# Patient Record
Sex: Female | Born: 1935 | Race: White | Hispanic: No | Smoking: Former smoker
Health system: Southern US, Community
[De-identification: ages and names within clinical notes are randomized; demographics above are authoritative.]

## PROBLEM LIST (undated history)

## (undated) DIAGNOSIS — M1990 Unspecified osteoarthritis, unspecified site: Secondary | ICD-10-CM

## (undated) DIAGNOSIS — I10 Essential (primary) hypertension: Secondary | ICD-10-CM

## (undated) DIAGNOSIS — IMO0001 Reserved for inherently not codable concepts without codable children: Secondary | ICD-10-CM

## (undated) DIAGNOSIS — Z5189 Encounter for other specified aftercare: Secondary | ICD-10-CM

## (undated) DIAGNOSIS — C801 Malignant (primary) neoplasm, unspecified: Secondary | ICD-10-CM

## (undated) DIAGNOSIS — C029 Malignant neoplasm of tongue, unspecified: Secondary | ICD-10-CM

## (undated) HISTORY — PX: TUBAL LIGATION: SHX77

## (undated) HISTORY — PX: WISDOM TOOTH EXTRACTION: SHX21

## (undated) HISTORY — DX: Unspecified osteoarthritis, unspecified site: M19.90

## (undated) HISTORY — PX: APPENDECTOMY: SHX54

## (undated) HISTORY — PX: CATARACT EXTRACTION: SUR2

## (undated) HISTORY — PX: TOTAL HIP ARTHROPLASTY: SHX124

## (undated) HISTORY — DX: Malignant (primary) neoplasm, unspecified: C80.1

## (undated) HISTORY — DX: Malignant neoplasm of tongue, unspecified: C02.9

---

## 2006-01-30 ENCOUNTER — Inpatient Hospital Stay (HOSPITAL_COMMUNITY): Admission: RE | Admit: 2006-01-30 | Discharge: 2006-02-04 | Payer: Self-pay | Admitting: Orthopedic Surgery

## 2007-01-05 ENCOUNTER — Inpatient Hospital Stay (HOSPITAL_COMMUNITY): Admission: RE | Admit: 2007-01-05 | Discharge: 2007-01-08 | Payer: Self-pay | Admitting: Orthopedic Surgery

## 2009-01-21 ENCOUNTER — Emergency Department (HOSPITAL_COMMUNITY): Admission: EM | Admit: 2009-01-21 | Discharge: 2009-01-21 | Payer: Self-pay | Admitting: Emergency Medicine

## 2010-11-27 NOTE — H&P (Signed)
Stephanie [name], CARNEIRO NO.:  [id number]   MEDICAL RECORD NO.:  [id number]          PATIENT TYPE:  INP   LOCATION:  NA                           FACILITY:  [facility]   PHYSICIAN:  Madlyn Frankel. Charlann Boxer, M.D.  DATE OF BIRTH:  12/01/1925   DATE OF ADMISSION:  01/05/2007  DATE OF DISCHARGE:                              HISTORY & PHYSICAL   PROCEDURE:  Right total hip arthroplasty.   CHIEF COMPLAINTS:  Right hip and groin pain.   HISTORY OF PRESENT ILLNESS:  This is a 77 year old female with a history  persistent progressive right hip and groin pain secondary to  osteoarthritis.  She also has a history of left total hip arthroplasty  back in July 2007 by Dr. Charlann Boxer.  Her right hip has gotten worse. It has  been refractory to all conservative treatments.  She walks with the use  of a cane.  She has been presurgical assessed by Dr. Casimiro Needle Altheimer.   PAST MEDICAL HISTORY:  1. Bilateral hip osteoarthritis  2. Left total hip replacement July 2007  3. Hypertension.   PAST SURGICAL HISTORIES:  Tubal ligation and left total hip replacement.   FAMILY HISTORY:  Heart disease, cancer.   SOCIAL HISTORY:  Married to  [employer] [facility].  She is a retired Runner, broadcasting/film/video.  Primary caregiver will be husband, children and friends.   DRUG ALLERGIES:  NO KNOWN DRUG ALLERGIES.   MEDICATIONS:  1. Klor-Con 20 mEq 1 tablet daily  2. __________ 300 mg one daily  3. Amlodipine 5 mg one daily  4. Carvedilol 6.25 mg 1 tablet b.i.d.  5. Altace 10 mg one daily  6. Evista 60 mg one daily.   REVIEW OF SYSTEMS:  GENITOURINARY:  Has issues with some urinary  frequency, urgency and trouble with  excessive urinating at night.  Otherwise see history of present illness.   PHYSICAL EXAM:  Pulse 72, respirations 18, blood pressure 134/76.  GENERAL:  Awake, alert and oriented, well-developed, well-nourished,in  no acute distress.  Does walk with a single point cane.  NECK:  Supple.  No carotid bruits.  CHEST/LUNGS:  Clear to auscultation bilaterally.  BREASTS:  Deferred.  HEART:  Regular rate and rhythm without gallops, clicks, rubs or  murmurs.  ABDOMEN:  Soft, nontender, nondistended.  Bowel sounds present.  GENITOURINARY:  Deferred.  EXTREMITIES:  Right lower extremity has dorsalis pedis pulse positive.  She has increased pain with range of motion.  She does have bilateral  edema both lower extremities.  SKIN:  No signs of cellulitis.  No skin breakdown.  NEUROLOGIC:  Intact distal sensibilities.   Labs, EKG, chest x-ray pending presurgical clearance performed on  12/31/2006.   IMPRESSION:  1. Right hip osteoarthritis  2. Status post a left total hip replacement  3. Hypertension.   PLAN OF ACTION:  A right total hip arthroplasty [facility] 01/05/2007  by surgeon Dr. Lajoyce Corners.  Risks and complications were discussed.  Questions were encouraged, answered, and  reviewed.   Postop medications were discussed and prescription given for pre-fill.     ______________________________  Yetta Glassman.  [address], [place]      Madlyn Frankel. Charlann Boxer, M.D.  Electronically Signed    BLM/MEDQ  D:  01/01/2007  T:  01/01/2007  Job:  [id number]

## 2010-11-27 NOTE — Op Note (Signed)
NAMEREMY, DIA NO.:  [id number]   MEDICAL RECORD NO.:  [id number]          PATIENT TYPE:  INP   LOCATION:  0002                         FACILITY:  [facility]   PHYSICIAN:  Madlyn Frankel. Charlann Boxer, M.D.  DATE OF BIRTH:  May 22, 1926   DATE OF PROCEDURE:  01/05/2007  DATE OF DISCHARGE:                               OPERATIVE REPORT   PREOPERATIVE DIAGNOSES:  1. Right hip osteoarthritis.  2. History of right total hip replacement.   POSTOPERATIVE DIAGNOSES:  1. Right hip osteoarthritis.  2. History of right total hip replacement.   PROCEDURE:  Right total hip replacement.   COMPONENTS USED:  DePuy hip system with a size 48 pinnacle cup, neutral  Marathon liner, a Tri-Lock size 4 high offset stem with a 28+5 ball.   SURGEON:  Charlann Boxer.   ASSISTANT:  Joan Mayans, P.A.C.   ANESTHESIA:  General.   BLOOD LOSS:  600.   DRAINS:  None.   COMPLICATIONS:  None.   INDICATIONS FOR THE PROCEDURE:  Ms. Vanleeuwen is a 77 year old female known  to me from bilateral hip arthritis.  She is now over a year out from her  left total hip replacement.  She has done very well and wished to  proceed with right hip replacement.  Reviewed risks and benefits of hip  dislocation, DVT, component failure, need for revision surgery.  Consent  was obtained.   PROCEDURE IN DETAIL:  The patient was brought to the operative theater.  Once adequate anesthesia and preoperative antibiotics, 1 g of Ancef,  were administered, patient was positioned in the left lateral decubitus  position with the right side up.  It was noted that in the supine  position I identified that the left lower extremity was 5 mm longer than  the right and we would keep this in mind perioperatively.  In the left  lateral decubitus position, I evaluated and positioned the legs and  flexed the hip and knee and did not find landmarks to determine her hip  stability and leg length.  This is all given the findings in the supine  position.   The right hip was then prescrubbed and prepped and draped in sterile  fashion.  A lateral-based incision was made through a posterior approach  to the hip.  The iliotibial band and gluteus fascia were incised in line  with incision.  The short external rotators were identified and taken  down separately from the posterior capsule.  A Bunnell capsulotomy was  made, preserving the posterior leaflet for later anatomic repair as well  as protection against the sciatic nerve.  The hip was dislocated and the  patient was noted to have severe degenerative changes to the right hip  with some femoral head collapse.   A neck osteotomy was made based off her anatomic position in the center  of the head in relationship to the hip of the trochanter.   Following neck osteotomy, first attention was directed to the femur.  I  then used a starting drill and then hand reamed and irrigated the canal  to prevent fat emboli.  I then broached up to initially expose size 3.  At this point the size 3 broach sat approximately a mm out from the  neck.   I then packed off the femur with a sponge and attended to the  acetabulum.  Acetabular exposure was obtained routinely.  Patient was  noted to have a very hyperemic and hypertrophic synovium and this was  debrided using a Bovie.  I then used a knife to perform a complete  labrectomy.  I then began reaming with a 43 reamer, then a 45, then a  47.  At this point the posterior wall was thinning and I did not feel  that it was necessary to jump a size as I had a good bony bed  preparation that would not affect my head size, given the component I  was planning on using which was a Marathon polyethylene liner.  In the  contralateral hip I used a 50 mm shell with a 28 mm head and I was not  going to correct that affected head size by going up to a 52 mm size.  At this point, a size 48 pinnacle cup was then impacted, it was  positioned at about 40 degrees  of abduction and 20 degrees of forward  flexion.  It was beneath the anterior wall anteriorly.  Two cancellous  bone screws were placed.  A neutral Marathon liner was then placed.  Attention was now directed back to trial reduction.  I placed a 3 stem  in place with a 28+1.5 ball.  There was a significant amount of shuck.  For this reason I went and changed this out to a 4 stem.  This 4 broach  sat about 4 mm from my neck cut.  With a 28+1.5 ball in place, this  significantly improved the shuck.  There was still a couple of  millimeters shuck despite not using spinal.  Trial reduction revealed  very stable range of motion.  The leg length appeared to be much more  equal at this point.  When I say equal what I mean compared to what we  had done from our preoperative state.  It still was felt a little bit  short and for that reason I trialed with a 28+5 ball.  I felt the hip  stability was excellent.  There was only a millimeter or two of shuck at  this point and the length of the leg appeared to be much more  appropriate compared to the other leg.  Given all of these parameters,  the trial components were removed.  The final 48 x 28 neutral Marathon  liner was then impacted into the pinnacle cup after placing a hole  eliminator.  I then impacted the size 4 high offset Tri-Lock stem.  This  sat at the level of where my broach was placed, which was approximately  4 mm from my neck cut.  Given this, I used a 28+5 ball.  This was  impacted on a dry joint and hip reduced.  The hip was irrigated  throughout the case and again at this point there is no significant  hemostasis.  There was some oozing during the case resulting in 600-700  mL blood loss mainly due to this hypertrophic synovium.  The patient was  also noted to have some elevated blood pressure during the case.  At the  time of the case I did use 5 mL of FloSeal.  There was no significant hemostasis required.  The posterior capsule was  reapproximated  superiorly using #1 Ethibond.  The remaining wound was closed in layers  with #1 Ethibond on the iliotibial band, #1 Vicryl in the gluteal  fascia, #2-0 Vicryl and #4-0 running Monocryl on the skin.  The skin was  clean, dry and dressed sterilely with Steri-Strips, Mepilex dressing.  She was then extubated and brought to the recovery room in stable  condition.      Madlyn Frankel Charlann Boxer, M.D.  Electronically Signed     MDO/MEDQ  D:  01/05/2007  T:  01/05/2007  Job:  [id number]

## 2010-11-30 NOTE — Discharge Summary (Signed)
[name], [name] NO.:  [id number]   MEDICAL RECORD NO.:  [id number]          PATIENT TYPE:  INP   LOCATION:  1603                         FACILITY:  [facility]   PHYSICIAN:  Madlyn Frankel. Charlann Boxer, M.D.  DATE OF BIRTH:  1926-01-04   DATE OF ADMISSION:  01/30/2006  DATE OF DISCHARGE:  02/04/2006                                 DISCHARGE SUMMARY   PRINCIPAL DIAGNOSIS:  Bilateral hip osteoarthritis, left greater than right.   SURGICAL PROCEDURES PERFORMED:  A left total hip replacement was performed  on January 30, 2006 by Dr. Charlann Boxer.   SECONDARY DIAGNOSES:  1.  Coronary artery disease.  2.  Hypertension.   DISCHARGE MEDICATIONS:  1.  Lovenox 30 mg subcutaneous daily x8 days to be stopped on February 12, 2006.  2.  Colace 100 mg p.o. b.i.d. p.r.n. for constipation while on pain      medication.  3.  Iron supplements 325 mg p.o. t.i.d. x3 weeks.  4.  Pain medication will be Vicodin one to two tablets p.o. q.4-6h. p.r.n.      for pain.  5.  Robaxin 500 mg p.o. q.i.d. p.r.n. for spasms.  6.  Her home medications include Altace 10 mg p.o. q.a.m., Norvasc 5 mg p.o.      every morning, Evista 60 mg p.o. every morning, calcium 600 mg four      times daily, vitamin C 500 mg four times daily, vitamin A and D,      glucosamine chondroitin sulfate which she does not need at this point,      multivitamin p.o. daily, and a B complex in the morning.   BRIEF HOSPITAL COURSE:  The patient was admitted on January 30, 2006 for a left  total hip replacement.  She had an uneventful total hip replacement  procedure.  She was admitted to the floor.   She did have some hypokalemia during her hospital stay.  This was corrected  with potassium supplementation.   She progressed well through her hospital course without any major  complications.  She did not require any transfusion.  She was seen and  evaluated by physical therapy, got her out of bed.  Based on her evaluation  initially with  therapy home health could have been an option for her.  However, she has no social help at home with a husband who is fairly sick  and family unable to care for her and him as well.  For this reason, we  consulted case management to help with short-term skilled nursing facility  for rehabilitation.  This was arranged with the family on February 04, 2003 for  discharge on February 04, 2006.   DISCHARGE INSTRUCTIONS:  The patient will be discharged with a prescription  of partial weightbearing on left lower extremity for two weeks.  She will  progress from a walker to a cane at that point.  I will see her back in the  office two weeks from the date of surgery for a wound evaluation.  She will  remain on Lovenox for 10 days  postoperatively and then be switched to  aspirin for four weeks orally daily 325 mg.   Medical questions could be addressed to Dr. Casimiro Needle Altheimer and orthopedic  issues addressed to Dr. Madlyn Frankel. Olin at [phone].      Madlyn Frankel Charlann Boxer, M.D.  Electronically Signed     MDO/MEDQ  D:  02/04/2006  T:  02/04/2006  Job:  [id number]

## 2010-11-30 NOTE — Op Note (Signed)
NAMEBEDIE, DOMINEY NO.:  [id number]   MEDICAL RECORD NO.:  [id number]          PATIENT TYPE:  INP   LOCATION:  0002                         FACILITY:  [facility]   PHYSICIAN:  Madlyn Frankel. Charlann Boxer, M.D.  DATE OF BIRTH:  [birth year]   DATE OF PROCEDURE:  01/30/2006  DATE OF DISCHARGE:                                 OPERATIVE REPORT   PREOPERATIVE DIAGNOSIS:  End-stage left hip osteoarthritis.   POSTOPERATIVE DIAGNOSIS:  End-stage left hip osteoarthritis.   PROCEDURE:  Left total hip replacement.   COMPONENTS USED:  DePuy hip system with size 50 pinnacle cup, 2 cancellous  bone screws, a 28+ 20 degree high wall liner placed at the 2 to 3 position  posteriorly.   The femoral stem with a Trilock high offset stem size 11.3 with a 28 1.5  ball.   SURGEON:  Madlyn Frankel. Charlann Boxer, M.D.   ASSISTANTDruscilla Brownie. Cherlynn June.   ANESTHESIA:  General.   BLOOD LOSS:  250.   DRAINS:  One.   COMPLICATIONS:  None.   INDICATIONS FOR PROCEDURE:  Ms. Primiano is a pleasant 77 year old female who  has been seen in the office for advanced degenerative changes of the left  hip.  She had significant reduction in her quality of life, and after  reviewing risks and benefits of operative intervention, she wished to  proceed with surgical replacement hip.  Risks and benefits were discussed  including infection, DVT, dislocation, persistent discomfort, and need for  future surgery.   Consent was obtained.   PROCEDURE IN DETAIL:  The patient was brought to operative theater.  Once  adequate anesthesia and preoperative antibiotics, 1 gram of Ancef, were  administered, the patient was positioned in the right lateral decubitus  position with the left side up.  The left lower extremity was then prepped  and draped in a sterile fashion after prescrub.  A curvilinear lateral based  incision was made for posterior approach to the hip.  The iliotibial band  and gluteus maximus fascia were  incised in line with incision.  The short  external rotators were taken down separate from the posterior capsule which  was saved for repair anatomically as well as used for protection against the  sciatic nerve with retractors.   The hip was dislocated, and femoral head was noted be severely deformed with  flattening deformity and severe erosive changes.  The neck osteotomy was  made based off of anatomic landmarks and preoperative templating in  relationship to the tip of the trochanter.   Following this, attention was first directed to the femur.  Femoral exposure  obtained with retractors and prepared per protocol for the Trilock system.  The canal was opened, irrigated and hand reamed singly and then broached.  Broaching was carried up initially all the way to a 10 to establish  diversion of the femur at 20-25 degrees.  Attention was then directed to the  acetabulum.   Acetabular exposure was obtained including labrectomy, and capsular tissues  were preserved.  The reaming commenced initially with a 43 reamer to get  through the medial wall osteophyte and synovium.  I then reamed up  sequentially.  Bony bed preparation appeared to be very good.  There was  some thinning of the posterior wall, but it remained intact.  The columns  superior and inferiorly posteriorly were all very stable and stout.  I  decided to stop with a 49 reamer so as to not ream further bone.  This would  limit my head size only.   Final 50-mm pinnacle cup was then impacted with an excellent scratch fit.  Please note the patient did have acetabular cysts that were curetted out and  packed with femoral head bone graft.  The cup was positioned and checked in  position and noted to be between 35 and 45 degrees of abduction and 15 to 20  degrees of for flexion.  It was beneath the anterior rim.   Two cancellous bone screws were placed into the ileum for initial support.  The trial liner was then positioned in  neutral.   Attention was then redirected back to the femur.  Final preparation of the  femur was carried out.  Initially, I trialed with a 10 broach which was able  to the subsided slightly beneath my neck cut.  I trialed this to check for  stability.   With this, the leg lengths appeared to be within proximity of one another.  However, with traction and extension, there was a significant amount of  shuck.  With this, I went up to the 11.3 broach.  This took a little bit  effort to prepare the proximal femur but without complication.  It was a  little proud compared to the neck cut initially.  Trial reduction was  carried out.  The hip stability was excellent with flexion and internal  rotation.  At about 70 degrees, there was a little bit of lift off with  internal rotation and neutral abduction and forward flexion.   This was kept in line for my final components.   At this point, final components were brought to the field.  The central hole  eliminator was positioned into the center portion of the cup.  I did choose  a 28 lipped liner to provide a little bit of support with flexion and  internal rotation.  This was impacted into position with the lipped portion  between the 2 and 3 o'clock position posteriorly.   The final 11.3 broach was then brought into the field and impacted.  I was  able to get this impacted very close to my initial neck cut which provided  security now from leg length standpoint.  Trial reduction was then carried  out with a 1.5 ball, and I felt very happy with the stability, leg lengths  and this current system.  The final 28 1.5 ball was impacted and noted to be  stable.   At this time and throughout the case, the wound was irrigated normal saline  solution.  I reapproximated posterior capsule to the superior leaflet in an  anatomic fashion.  The Hemovac drain was then placed deep.  The remainder of the wound was closed in layers with #1 Vicryl on the   iliotibial band and gluteal fascia.  Four-0 Monocryl was used on the skin.  The hip was cleaned, dried and dressed sterilely with Steri-Strips, dressing  and sponge tape.  The patient was brought to the recovery room in stable  condition.      Madlyn Frankel Charlann Boxer, M.D.  Electronically  Signed     MDO/MEDQ  D:  01/30/2006  T:  01/30/2006  Job:  [id number]

## 2010-11-30 NOTE — Discharge Summary (Signed)
NAMEJOSELINE, Stephanie [name] NO.:  [id number]   MEDICAL RECORD NO.:  [id number]          PATIENT TYPE:  INP   LOCATION:  1611                         FACILITY:  [facility]   PHYSICIAN:  Madlyn Frankel. Charlann Boxer, M.D.  DATE OF BIRTH:  Jul 05, 1926   DATE OF ADMISSION:  01/05/2007  DATE OF DISCHARGE:  01/08/2007                               DISCHARGE SUMMARY   ADMITTING DIAGNOSES:  1. Osteoarthritis.  2. Status post left total hip replacement, July 2007.  3. Hypertension.   DISCHARGE DIAGNOSES:  1. Osteoarthritis.  2. Status post right total hip replacement.  3. Status post left total hip replacement.  4. Hypertension.  5. Acute blood loss anemia.  6. Postoperative hypokalemia.   CONSULTATIONS:  None.   PROCEDURE:  A right total hip arthroplasty.  Surgeon:  Madlyn Frankel. Charlann Boxer,  M.D.  Assistant:  Jamelle Rushing, P.A.  Components were metal-on-metal.   HISTORY OF PRESENT ILLNESS:  Ms. Full is a 77 year old female with a  history of persistent progressive right hip and groin pain secondary to  osteoarthritis.  She does have a history of left total hip arthroplasty.  Her right hip had gotten worse and been refractory to all conservative  treatments.  She has had to use a single-point cane.  She was cleared  pre-surgically by Dr. Casimiro Needle Altheimer.   LABORATORY:  Preoperative hematocrit 39.4, post-op day #1, it was 23.7;  transfused 2 units and it came back up to 26.6.  At discharge it was  stable at 24.4.  Coagulation within normal limits.  Chemistry:  Glucose  on admission 114, all other normal.  On post-op day #1, she had some  hypokalemia at 3.1.  She was replenished and it came back up to 4.2.  Her glucose went up a little bit to 156 at discharge.  Her kidney  function remained stable throughout.  Her GI function within normal  limits.  UA negative.   CARDIOLOGY:  EKG:  Normal sinus rhythm.   RADIOLOGY:  Chest, two-view, back in 2007, showed no active disease.  AP  pelvis  showed normal positive appearance of right total hip replacement.   HOSPITAL COURSE:  The patient underwent right total hip replacement,  tolerated the procedure well, was admitted to the orthopedic floor.  She  remained neurovascularly intact throughout.  Pain was well controlled  throughout.  Postoperative day #2, we started changing the dressing.  There was no sign of infection.  No serosanguineous ooze.  It was noted  she did have some postoperative blood loss and therefore was transfused  2 units due to acute blood loss anemia on post-op day #1 to #2.  Her  hematocrit came back up afterwards.  She remained afebrile throughout.  PT OT were begun.  Weightbearing as tolerated with the use of a rolling  walker.  She progressed nicely.  By the time of discharge, she was able  to walk approximately 60 feet with verbal cues and minimal assistance.  PT, OT, case worker recommendations were for home health care physical  therapy.   DISCHARGE DISPOSITION:  Discharged home healthcare  PT.   DISCHARGE WOUND CARE:  Keep the wound dry and change dressing on a daily  basis.   DISCHARGE DIET:  No restrictions.   DISCHARGE PHYSICAL THERAPY:  Weightbearing as tolerated with the use of  a rolling walker for 2 weeks, then transition to single-point cane.  Goals of physical therapy, work on gait training, proprioception,  minimize pain, maximize strength, increase range of motion.   DISCHARGE MEDICATIONS:  1. Vicodin 5/325, one to two p.o. q.4-6 h. p.r.n. pain.  2. Robaxin 500 mg, one p.o. q.6 h. muscle spasm.  3. Lovenox 40 mg, one subcutaneous daily x2 weeks and then aspirin,      enteric coated 325 mg one p.o. daily x4 weeks.  4. Iron 325 mg, one p.o. t.i.d. x3 weeks.  5. Colace 100 mg p.o. b.i.d.   HOME MEDICATIONS:  1. Evista 60 mg one q.a.m.  2. Amlodipine 5 mg, one p.o. q.a.m.  3. Carvedilol 6.25 mg, one twice a day.  4. Altace 10 mg, one q.a.m.  5. Tekturna 300 mg, one q.a.m.  6.  Klor-Con 20 mEq, one q.a.m.  7. Vitamins C, A, D, plus calcium, multivitamin and iron, B complex,      glucosamine/chondroitin, magnesium.   DISCHARGE INSTRUCTIONS:  1. Follow up with Dr. Charlann Boxer on twelfth floor, 3900, , in two weeks for      wound care check.  2. If develop acute shortness of breath, severe calf pain, call      emergency services immediately.     ______________________________  Yetta Glassman [address], [place]      Madlyn Frankel. Charlann Boxer, M.D.  Electronically Signed    BLM/MEDQ  D:  01/26/2007  T:  01/26/2007  Job:  [id number]

## 2010-11-30 NOTE — H&P (Signed)
Stephanie [name], Stephanie [name] NO.:  [id number]   MEDICAL RECORD NO.:  [id number]          PATIENT TYPE:  INP   LOCATION:  NA                           FACILITY:  [facility]   PHYSICIAN:  Madlyn Frankel. Charlann Boxer, M.D.  DATE OF BIRTH:  13-Feb-1926   DATE OF ADMISSION:  01/30/2006  DATE OF DISCHARGE:                                HISTORY & PHYSICAL   CHIEF COMPLAINT:  Pain in my hips, more so in the left than right.   HISTORY OF PRESENT ILLNESS:  This 77 year old lady who has been having  bilateral hip pain for a considerable period of time.  Now she has pain  radiating to the groin to the point where she has to use a cane for  ambulation, and any sustained distance must be transversed by wheelchair.  She has developed a limp, mainly on the left side.  Trendelenburg gait is  also noted.  She has limitation of range of motion of the hip and has a  considerable amount of pain and discomfort on end rotation.   After much discussion, including the risks and benefits of surgery, it was  decided to go ahead with total hip replacement arthroplasty on the most  symptomatic hip, which is her left hip.  She has been cleared preoperatively  by Dr. Casimiro Needle Altheimer.   PAST MEDICAL HISTORY:  Dr. Leslie Dales is treating her for hypertension.   CURRENT MEDICATIONS:  1.  Altace 10 mg daily.  2.  Amlodipine (Norvasc) 5 mg daily.  3.  Hydrocodone for pain.   PAST SURGICAL HISTORY:  Tubal ligation, appendectomy, tonsillectomy in the  past.   FAMILY HISTORY:  Positive for heart disease in the mother and cancer in the  father.   SOCIAL HISTORY:  Patient is married, retired.  No intake of alcohol or  tobacco products.  Has three children.   REVIEW OF SYSTEMS:  CNS:  No seizures, paralysis, numbness, double vision.  RESPIRATORY:  No productive cough.  No hemoptysis.  No shortness of breath.  CARDIOVASCULAR:  No chest pain.  No angina.  No orthopnea.  GASTROINTESTINAL:  No nausea, vomiting, melena,  or bloody stools.  GENITOURINARY:  No discharge, dysuria, or hematuria.  MUSCULOSKELETAL:  Primarily in present illness.   PHYSICAL EXAMINATION:  VITAL SIGNS:  Blood pressure 132/74, pulse 76,  respirations 12.  GENERAL:  She is an alert, cooperative, and friendly white female.  HEENT:  Normocephalic.  PERRLA.  Oropharynx is clear.  CHEST:  Clear to auscultation.  No rales or rhonchi.  HEART:  Regular rate and rhythm.  No murmurs are heard.  ABDOMEN:  Soft and nontender.  Liver and spleen not felt.  RECTAL/GENITALIA/PELVIC/BREASTS:  Not done.  Not pertinent to the present  illness.  EXTREMITIES:  Left hip, as in present illness above.   ADMITTING DIAGNOSES:  1.  Osteoarthritis of both hips, more so on the left than on the right.  2.  Hypertension.  3.  Osteoarthritis.   PLAN:  Patient is to undergone left total hip replacement arthroplasty.  Her  husband is being worked up for mini  strokes versus early Alzheimer's and  really could not take care of her at home.  For this reason, we will seek a  skilled nursing facility that has a good rehab program or [facility]  Rehabilitation after her regular hospitalization.  We will ask the discharge  planners to work with [place] on that.  Should we have any medical problems, we  will certainly call Dr. Leslie Dales to assist [place] with any medical problems.      Dooley L. Cherlynn June.      Madlyn Frankel Charlann Boxer, M.D.  Electronically Signed    DLU/MEDQ  D:  01/23/2006  T:  01/23/2006  Job:  [id number]   cc:   Veverly Fells. Altheimer, M.D.  Fax: [phone]

## 2011-05-01 LAB — CBC
HCT: 23.7 — ABNORMAL LOW
HCT: 39.4
Hemoglobin: 9.2 — ABNORMAL LOW
MCHC: 34.6
MCHC: 34.8
Platelets: 234
Platelets: 292
RBC: 2.83 — ABNORMAL LOW
RBC: 4.64
RDW: 14.2 — ABNORMAL HIGH
RDW: 14.4 — ABNORMAL HIGH
WBC: 7.5

## 2011-05-01 LAB — TYPE AND SCREEN: ABO/RH(D): O POS

## 2011-05-01 LAB — URINALYSIS, ROUTINE W REFLEX MICROSCOPIC
Glucose, UA: NEGATIVE
Protein, ur: NEGATIVE
Specific Gravity, Urine: 1.02
Urobilinogen, UA: 0.2

## 2011-05-01 LAB — HEMOGLOBIN AND HEMATOCRIT, BLOOD: Hemoglobin: 8.5 — ABNORMAL LOW

## 2011-05-01 LAB — COMPREHENSIVE METABOLIC PANEL
AST: 27
GFR calc Af Amer: >60
Total Bilirubin: 0.6

## 2011-05-01 LAB — BASIC METABOLIC PANEL
BUN: 11
CO2: 29
Creatinine, Ser: 0.78
GFR calc Af Amer: >60
GFR calc non Af Amer: >60
Glucose, Bld: 108 — ABNORMAL HIGH
Glucose, Bld: 156 — ABNORMAL HIGH

## 2011-05-01 LAB — APTT: aPTT: 32

## 2011-08-11 ENCOUNTER — Ambulatory Visit (INDEPENDENT_AMBULATORY_CARE_PROVIDER_SITE_OTHER): Payer: Medicare Other

## 2011-08-11 DIAGNOSIS — S61409A Unspecified open wound of unspecified hand, initial encounter: Secondary | ICD-10-CM | POA: Diagnosis not present

## 2011-08-11 DIAGNOSIS — M25549 Pain in joints of unspecified hand: Secondary | ICD-10-CM | POA: Diagnosis not present

## 2011-08-11 DIAGNOSIS — S60229A Contusion of unspecified hand, initial encounter: Secondary | ICD-10-CM

## 2011-08-11 DIAGNOSIS — S065X9A Traumatic subdural hemorrhage with loss of consciousness of unspecified duration, initial encounter: Secondary | ICD-10-CM | POA: Diagnosis not present

## 2011-08-11 DIAGNOSIS — S065XAA Traumatic subdural hemorrhage with loss of consciousness status unknown, initial encounter: Secondary | ICD-10-CM | POA: Diagnosis not present

## 2011-08-12 ENCOUNTER — Ambulatory Visit (INDEPENDENT_AMBULATORY_CARE_PROVIDER_SITE_OTHER): Payer: Medicare Other

## 2011-08-12 DIAGNOSIS — S61409A Unspecified open wound of unspecified hand, initial encounter: Secondary | ICD-10-CM

## 2011-08-13 DIAGNOSIS — E785 Hyperlipidemia, unspecified: Secondary | ICD-10-CM | POA: Diagnosis not present

## 2011-08-13 DIAGNOSIS — R7301 Impaired fasting glucose: Secondary | ICD-10-CM | POA: Diagnosis not present

## 2011-08-13 DIAGNOSIS — E559 Vitamin D deficiency, unspecified: Secondary | ICD-10-CM | POA: Diagnosis not present

## 2011-08-13 DIAGNOSIS — I10 Essential (primary) hypertension: Secondary | ICD-10-CM | POA: Diagnosis not present

## 2011-08-13 DIAGNOSIS — M810 Age-related osteoporosis without current pathological fracture: Secondary | ICD-10-CM | POA: Diagnosis not present

## 2011-08-15 DIAGNOSIS — E785 Hyperlipidemia, unspecified: Secondary | ICD-10-CM | POA: Diagnosis not present

## 2011-08-15 DIAGNOSIS — R7301 Impaired fasting glucose: Secondary | ICD-10-CM | POA: Diagnosis not present

## 2011-08-15 DIAGNOSIS — I10 Essential (primary) hypertension: Secondary | ICD-10-CM | POA: Diagnosis not present

## 2011-08-15 DIAGNOSIS — M1990 Unspecified osteoarthritis, unspecified site: Secondary | ICD-10-CM | POA: Diagnosis not present

## 2011-08-15 DIAGNOSIS — M810 Age-related osteoporosis without current pathological fracture: Secondary | ICD-10-CM | POA: Diagnosis not present

## 2011-08-16 ENCOUNTER — Ambulatory Visit (INDEPENDENT_AMBULATORY_CARE_PROVIDER_SITE_OTHER): Payer: Medicare Other | Admitting: Family Medicine

## 2011-08-16 VITALS — BP 142/90 | HR 86 | Temp 98.9°F | Resp 18 | Ht 58.5 in | Wt 131.6 lb

## 2011-08-16 DIAGNOSIS — L02519 Cutaneous abscess of unspecified hand: Secondary | ICD-10-CM

## 2011-08-16 DIAGNOSIS — L03113 Cellulitis of right upper limb: Secondary | ICD-10-CM

## 2011-08-16 DIAGNOSIS — L03119 Cellulitis of unspecified part of limb: Secondary | ICD-10-CM | POA: Diagnosis not present

## 2011-08-16 DIAGNOSIS — M79609 Pain in unspecified limb: Secondary | ICD-10-CM

## 2011-08-16 DIAGNOSIS — M25449 Effusion, unspecified hand: Secondary | ICD-10-CM

## 2011-08-16 DIAGNOSIS — I10 Essential (primary) hypertension: Secondary | ICD-10-CM | POA: Insufficient documentation

## 2011-08-16 DIAGNOSIS — M79641 Pain in right hand: Secondary | ICD-10-CM

## 2011-08-16 NOTE — Patient Instructions (Signed)
Woundcare with normal sailine BID

## 2011-08-16 NOTE — Progress Notes (Signed)
  Subjective:    Patient ID: Stephanie [name], female    DOB: [birth year], 78 y.o.   MRN: [id number]  Fall The accident occurred 3 to 5 days ago. The fall occurred while walking. She landed on hard floor. The point of impact was the left wrist. Pertinent negatives include no fever, headaches or hematuria.  Laceration  The incident occurred 3 to 5 days ago. The laceration is located on the right hand. The laceration mechanism was a blunt object. The pain is moderate. The pain has been fluctuating since onset. Her tetanus status is UTD.  Patient fell on Sunday 5 days ago and Tuesday night started having swelling. Xrays was negative for any acute fracture. She went to see her PCP yesterday and was given Keflex for possible infection. Right hand is hot and swollen.    Review of Systems  Constitutional: Negative.  Negative for fever and chills.  Eyes: Negative for visual disturbance.  Respiratory: Negative for chest tightness, shortness of breath and wheezing.   Cardiovascular: Negative for chest pain, palpitations and leg swelling.  Genitourinary: Negative for dysuria and hematuria.  Musculoskeletal: Positive for joint swelling.  Neurological: Negative for dizziness, light-headedness and headaches.       Objective:   Physical Exam  Constitutional: She is oriented to person, place, and time. She appears well-developed and well-nourished.  HENT:  Head: Normocephalic and atraumatic.  Eyes: EOM are normal.  Neck: Normal range of motion. Neck supple.  Cardiovascular: Normal rate, regular rhythm and normal heart sounds.   Pulmonary/Chest: Effort normal and breath sounds normal. She has no wheezes.  Abdominal: Soft. Bowel sounds are normal.  Neurological: She is alert and oriented to person, place, and time.  Skin: Skin is warm. No rash noted. There is erythema. No pallor.       Right hand laceration with stitches, draining , erythematous, warm  Psychiatric: She has a normal mood and affect.    Neurovascularly intact. + ecchymosis. Full Rom, Serosainguinous fluid. Slightly warm left hand on dorsum.  No signs of cellulitis , pt is on Keflex. She has draining but not purulent.       Assessment & Plan:  1. Right hand swelling-? Cellulitis, c/w Keflex. If no improvement then return in 2 days. I have taken out all the stitches to improve wound healing and drainage.There is good approximation of 90% of skin but there will be an area in the middle which is still draining fluid that will close by secondary intention.  2. Right hand pain-improving slowly

## 2011-08-23 ENCOUNTER — Ambulatory Visit (INDEPENDENT_AMBULATORY_CARE_PROVIDER_SITE_OTHER): Payer: Medicare Other | Admitting: Family Medicine

## 2011-08-23 VITALS — BP 147/72 | HR 71 | Temp 97.9°F | Resp 16 | Ht 60.0 in | Wt 129.8 lb

## 2011-08-23 DIAGNOSIS — S61409A Unspecified open wound of unspecified hand, initial encounter: Secondary | ICD-10-CM | POA: Diagnosis not present

## 2011-08-23 NOTE — Patient Instructions (Signed)
Return if further problems. She can gently wash at the scan. She was urged to exercise her grip with that hand.

## 2011-08-23 NOTE — Progress Notes (Signed)
  Subjective:    Patient ID: Stephanie [name], female    DOB: 1925-07-19, 78 y.o.   MRN: [id number]  HPI patient is here for recheck of her hand. She saw Dr. Conley Rolls last week who put Steri-Strips on after having to remove the sutures. She had a hematoma which was probably a little infected it has gradually gotten better. She is able to move her fingers okay to   Review of Systems Unremarkable    Objective:   Physical Exam arthritic appearing hand with bruising down to all 4 fingertips. She has a resolving hematoma on the back of her hand. The 2 remaining Steri-Strips were removed. The wound is intact. Grip is satisfactory.  The x-ray report had shown a question of an old or recent fracture at the base of the fifth metacarpal. She is not tender in that area. It is felt that that is not significant.        Assessment & Plan:  Wound hand with cellulitis and hematoma improved  She only needs to be seen if further problems arise.

## 2011-09-11 DIAGNOSIS — C029 Malignant neoplasm of tongue, unspecified: Secondary | ICD-10-CM

## 2011-09-11 DIAGNOSIS — C801 Malignant (primary) neoplasm, unspecified: Secondary | ICD-10-CM

## 2011-09-11 DIAGNOSIS — C021 Malignant neoplasm of border of tongue: Secondary | ICD-10-CM | POA: Diagnosis not present

## 2011-09-11 HISTORY — DX: Malignant (primary) neoplasm, unspecified: C80.1

## 2011-09-11 HISTORY — DX: Malignant neoplasm of tongue, unspecified: C02.9

## 2011-09-16 DIAGNOSIS — C029 Malignant neoplasm of tongue, unspecified: Secondary | ICD-10-CM | POA: Diagnosis not present

## 2011-09-18 ENCOUNTER — Other Ambulatory Visit: Payer: Self-pay | Admitting: Otolaryngology

## 2011-09-18 ENCOUNTER — Other Ambulatory Visit (HOSPITAL_COMMUNITY): Payer: Self-pay | Admitting: Otolaryngology

## 2011-09-18 DIAGNOSIS — C029 Malignant neoplasm of tongue, unspecified: Secondary | ICD-10-CM

## 2011-09-19 ENCOUNTER — Ambulatory Visit
Admission: RE | Admit: 2011-09-19 | Discharge: 2011-09-19 | Disposition: A | Discharge status: Home / Self Care | Payer: Medicare Other | Source: Ambulatory Visit | Attending: Otolaryngology | Admitting: Otolaryngology

## 2011-09-19 ENCOUNTER — Encounter: Payer: Self-pay | Admitting: *Deleted

## 2011-09-19 DIAGNOSIS — M1990 Unspecified osteoarthritis, unspecified site: Secondary | ICD-10-CM | POA: Insufficient documentation

## 2011-09-19 DIAGNOSIS — C029 Malignant neoplasm of tongue, unspecified: Secondary | ICD-10-CM | POA: Diagnosis not present

## 2011-09-19 DIAGNOSIS — M47812 Spondylosis without myelopathy or radiculopathy, cervical region: Secondary | ICD-10-CM | POA: Diagnosis not present

## 2011-09-19 MED ORDER — IOHEXOL 300 MG/ML  SOLN
75.0000 mL | Freq: Once | INTRAMUSCULAR | Status: AC | PRN
  Administered 2011-09-19: 75 mL via INTRAVENOUS

## 2011-09-20 ENCOUNTER — Ambulatory Visit
Admission: RE | Admit: 2011-09-20 | Discharge: 2011-09-20 | Disposition: A | Discharge status: Home / Self Care | Payer: Medicare Other | Source: Ambulatory Visit | Attending: Radiation Oncology | Admitting: Radiation Oncology

## 2011-09-20 ENCOUNTER — Encounter: Payer: Self-pay | Admitting: Radiation Oncology

## 2011-09-20 DIAGNOSIS — C021 Malignant neoplasm of border of tongue: Secondary | ICD-10-CM | POA: Insufficient documentation

## 2011-09-20 DIAGNOSIS — I10 Essential (primary) hypertension: Secondary | ICD-10-CM | POA: Diagnosis not present

## 2011-09-20 DIAGNOSIS — C023 Malignant neoplasm of anterior two-thirds of tongue, part unspecified: Secondary | ICD-10-CM | POA: Diagnosis not present

## 2011-09-20 DIAGNOSIS — C029 Malignant neoplasm of tongue, unspecified: Secondary | ICD-10-CM

## 2011-09-20 DIAGNOSIS — Z87891 Personal history of nicotine dependence: Secondary | ICD-10-CM | POA: Insufficient documentation

## 2011-09-20 DIAGNOSIS — Z79899 Other long term (current) drug therapy: Secondary | ICD-10-CM | POA: Insufficient documentation

## 2011-09-20 HISTORY — DX: Essential (primary) hypertension: I10

## 2011-09-20 NOTE — Progress Notes (Signed)
Please see the Nurse Progress Note in the MD Initial Consult Encounter for this patient. 

## 2011-09-20 NOTE — Progress Notes (Signed)
PCP Dr Altheimer  Pt has NOT had cataract surgery, impending , also impending bilat shoulder replacements.  Pt denies pain, but takes Hydrocodone prn for shoulder pain. Appetite good.

## 2011-09-24 ENCOUNTER — Encounter (HOSPITAL_COMMUNITY)
Admission: RE | Admit: 2011-09-24 | Discharge: 2011-09-24 | Disposition: A | Discharge status: Home / Self Care | Payer: Medicare Other | Source: Ambulatory Visit | Attending: Otolaryngology | Admitting: Otolaryngology

## 2011-09-24 ENCOUNTER — Encounter (HOSPITAL_COMMUNITY)
Admission: RE | Admit: 2011-09-24 | Discharge: 2011-09-24 | Disposition: A | Discharge status: Home / Self Care | Payer: Medicare Other | Source: Ambulatory Visit | Attending: Anesthesiology | Admitting: Anesthesiology

## 2011-09-24 ENCOUNTER — Other Ambulatory Visit: Payer: Self-pay

## 2011-09-24 ENCOUNTER — Encounter (HOSPITAL_COMMUNITY): Payer: Self-pay

## 2011-09-24 DIAGNOSIS — Z6826 Body mass index (BMI) 26.0-26.9, adult: Secondary | ICD-10-CM | POA: Diagnosis not present

## 2011-09-24 DIAGNOSIS — C029 Malignant neoplasm of tongue, unspecified: Secondary | ICD-10-CM | POA: Diagnosis not present

## 2011-09-24 DIAGNOSIS — R220 Localized swelling, mass and lump, head: Secondary | ICD-10-CM | POA: Diagnosis not present

## 2011-09-24 DIAGNOSIS — R221 Localized swelling, mass and lump, neck: Secondary | ICD-10-CM | POA: Diagnosis not present

## 2011-09-24 DIAGNOSIS — M129 Arthropathy, unspecified: Secondary | ICD-10-CM | POA: Diagnosis not present

## 2011-09-24 DIAGNOSIS — I10 Essential (primary) hypertension: Secondary | ICD-10-CM | POA: Diagnosis not present

## 2011-09-24 DIAGNOSIS — I517 Cardiomegaly: Secondary | ICD-10-CM | POA: Diagnosis not present

## 2011-09-24 DIAGNOSIS — Z01811 Encounter for preprocedural respiratory examination: Secondary | ICD-10-CM | POA: Diagnosis not present

## 2011-09-24 HISTORY — DX: Reserved for inherently not codable concepts without codable children: IMO0001

## 2011-09-24 HISTORY — DX: Encounter for other specified aftercare: Z51.89

## 2011-09-24 LAB — BASIC METABOLIC PANEL
CO2: 28 mEq/L (ref 19–32)
Chloride: 107 mEq/L (ref 96–112)
Glucose, Bld: 130 mg/dL — ABNORMAL HIGH (ref 70–99)
Potassium: 4 mEq/L (ref 3.5–5.1)
Sodium: 143 mEq/L (ref 135–145)

## 2011-09-24 LAB — CBC
HCT: 39.5 % (ref 36.0–46.0)
Hemoglobin: 12.7 g/dL (ref 12.0–15.0)
MCH: 28.2 pg (ref 26.0–34.0)
MCV: 87.6 fL (ref 78.0–100.0)
Platelets: 267 10*3/uL (ref 150–400)
RBC: 4.51 MIL/uL (ref 3.87–5.11)
WBC: 8.3 10*3/uL (ref 4.0–10.5)

## 2011-09-24 NOTE — Pre-Procedure Instructions (Signed)
20 Stephanie [name]  09/24/2011   Your procedure is scheduled on:  09/26/2011@9 :50AM.  Report to [facility] at 7:50 AM.  Call this number if you have problems the morning of surgery: [phone]   Remember:   Do not eat food:After Midnight.  May have clear liquids: up to 4 Hours before arrival(nothing after 3:50AM).  Clear liquids include soda, tea, black coffee, apple or grape juice, broth.  Take these medicines the morning of surgery with A SIP OF WATER: Hydrocodone-Acetaminophen, Amlodipine, Carvedilol, Evista   Do not wear jewelry, make-up or nail polish.  Do not wear lotions, powders, or perfumes. You may wear deodorant.  Do not shave 48 hours prior to surgery.  Do not bring valuables to the hospital.  Contacts, dentures or bridgework may not be worn into surgery.  Leave suitcase in the car. After surgery it may be brought to your room.  For patients admitted to the hospital, checkout time is 11:00 AM the day of discharge.   Patients discharged the day of surgery will not be allowed to drive home.  Name and phone number of your driver:   Special Instructions: CHG Shower Use Special Wash: 1/2 bottle night before surgery and 1/2 bottle morning of surgery.   Please read over the following fact sheets that you were given: Pain Booklet, Coughing and Deep Breathing and Surgical Site Infection Prevention

## 2011-09-24 NOTE — Progress Notes (Signed)
Abnormal EKG noted @PAT  visit.  Denies chest pain,& shortness of breath. Pt denies EKG, stress test, Echo from any provider.  Noted in Epic that pt had hip surgery in 01/2006.  Could not find EKG from that encounter.  Revonda Standard notified that chart needs to be reviewed.//L. Vara Mairena,RN

## 2011-09-25 ENCOUNTER — Encounter (HOSPITAL_COMMUNITY): Payer: Self-pay | Admitting: Pharmacy Technician

## 2011-09-25 NOTE — Consult Note (Signed)
Anesthesia:  Patient is a 78 year old female scheduled for a left hemiglossectomy for a tongue mass on 09/26/11.  PCP is Dr. Leslie Dales.  History includes bilateral THA, HTN, former smoker, arthritis, and history of blood transfusion.  Labs acceptable.  CXR shows: 1. Cardiomegaly and bronchitic changes.  2. No focal pulmonary abnormality.  3. Retrolisthesis of the upper lumbar spine versus positioning.  4. Degenerative changes in both shoulders.  EKG shows NSR, possible anterior infarct (age undetermined).  I think it is overall stable when compared to her EKG from 01/01/07.  CM was noted on her pre-operative CXR. She denied any prior cardiac testing. She did not report any CV symptoms at PAT.  Clinical correlation on the day of surgery.  If remains asymptomatic and no worrisome CV findings on exam, then anticipate she can proceed as planned.

## 2011-09-26 ENCOUNTER — Encounter (HOSPITAL_COMMUNITY)
Admission: RE | Disposition: A | Discharge status: Home / Self Care | Payer: Self-pay | Source: Ambulatory Visit | Attending: Otolaryngology

## 2011-09-26 ENCOUNTER — Encounter (HOSPITAL_COMMUNITY): Payer: Self-pay | Admitting: Vascular Surgery

## 2011-09-26 ENCOUNTER — Inpatient Hospital Stay (HOSPITAL_COMMUNITY)
Admission: RE | Admit: 2011-09-26 | Discharge: 2011-09-27 | DRG: 138 | Disposition: A | Discharge status: Home / Self Care | Payer: Medicare Other | Source: Ambulatory Visit | Attending: Otolaryngology | Admitting: Otolaryngology

## 2011-09-26 ENCOUNTER — Encounter (HOSPITAL_COMMUNITY): Payer: Self-pay | Admitting: *Deleted

## 2011-09-26 ENCOUNTER — Ambulatory Visit (HOSPITAL_COMMUNITY): Payer: Medicare Other | Admitting: Vascular Surgery

## 2011-09-26 DIAGNOSIS — Z6826 Body mass index (BMI) 26.0-26.9, adult: Secondary | ICD-10-CM | POA: Diagnosis not present

## 2011-09-26 DIAGNOSIS — I10 Essential (primary) hypertension: Secondary | ICD-10-CM | POA: Diagnosis not present

## 2011-09-26 DIAGNOSIS — Z886 Allergy status to analgesic agent status: Secondary | ICD-10-CM

## 2011-09-26 DIAGNOSIS — M129 Arthropathy, unspecified: Secondary | ICD-10-CM | POA: Diagnosis present

## 2011-09-26 DIAGNOSIS — Z87891 Personal history of nicotine dependence: Secondary | ICD-10-CM

## 2011-09-26 DIAGNOSIS — C029 Malignant neoplasm of tongue, unspecified: Secondary | ICD-10-CM

## 2011-09-26 DIAGNOSIS — Z96649 Presence of unspecified artificial hip joint: Secondary | ICD-10-CM | POA: Diagnosis not present

## 2011-09-26 DIAGNOSIS — Z9849 Cataract extraction status, unspecified eye: Secondary | ICD-10-CM

## 2011-09-26 DIAGNOSIS — Z79899 Other long term (current) drug therapy: Secondary | ICD-10-CM

## 2011-09-26 DIAGNOSIS — R220 Localized swelling, mass and lump, head: Secondary | ICD-10-CM | POA: Diagnosis not present

## 2011-09-26 HISTORY — PX: HEMIGLOSSECTOMY: SHX1740

## 2011-09-26 SURGERY — HEMIGLOSSECTOMY
Anesthesia: General | Site: Mouth | Laterality: Left | Wound class: Clean Contaminated

## 2011-09-26 MED ORDER — AMLODIPINE BESYLATE 5 MG PO TABS
5.0000 mg | ORAL_TABLET | Freq: Every day | ORAL | Status: DC
  Administered 2011-09-27: 5 mg via ORAL
  Filled 2011-09-26 (×2): qty 1

## 2011-09-26 MED ORDER — ONDANSETRON HCL 4 MG/2ML IJ SOLN: 4.0000 mg | INTRAMUSCULAR | Status: DC | PRN

## 2011-09-26 MED ORDER — CLINDAMYCIN PHOSPHATE 600 MG/4ML IJ SOLN
INTRAMUSCULAR | Status: AC
  Filled 2011-09-26: qty 4

## 2011-09-26 MED ORDER — RAMIPRIL 10 MG PO CAPS
10.0000 mg | ORAL_CAPSULE | Freq: Every day | ORAL | Status: DC
  Administered 2011-09-27: 10 mg via ORAL
  Filled 2011-09-26 (×2): qty 1

## 2011-09-26 MED ORDER — ONDANSETRON HCL 4 MG/2ML IJ SOLN
INTRAMUSCULAR | Status: DC | PRN
  Administered 2011-09-26: 4 mg via INTRAVENOUS

## 2011-09-26 MED ORDER — ACETAMINOPHEN 325 MG PO TABS: 325.0000 mg | ORAL_TABLET | ORAL | Status: DC | PRN

## 2011-09-26 MED ORDER — DEXAMETHASONE SODIUM PHOSPHATE 10 MG/ML IJ SOLN
INTRAMUSCULAR | Status: DC | PRN
  Administered 2011-09-26: 6 mg via INTRAVENOUS

## 2011-09-26 MED ORDER — DEXTROSE 5 % IV SOLN
INTRAVENOUS | Status: AC
  Filled 2011-09-26: qty 50

## 2011-09-26 MED ORDER — NEOSTIGMINE METHYLSULFATE 1 MG/ML IJ SOLN
INTRAMUSCULAR | Status: DC | PRN
  Administered 2011-09-26: 3 mg via INTRAVENOUS

## 2011-09-26 MED ORDER — KETOROLAC TROMETHAMINE 30 MG/ML IJ SOLN
INTRAMUSCULAR | Status: AC
  Filled 2011-09-26: qty 1

## 2011-09-26 MED ORDER — EPHEDRINE SULFATE 50 MG/ML IJ SOLN
INTRAMUSCULAR | Status: DC | PRN
  Administered 2011-09-26: 5 mg via INTRAVENOUS

## 2011-09-26 MED ORDER — FENTANYL CITRATE 0.05 MG/ML IJ SOLN
25.0000 ug | INTRAMUSCULAR | Status: DC | PRN
  Administered 2011-09-26 (×4): 25 ug via INTRAVENOUS

## 2011-09-26 MED ORDER — ONDANSETRON HCL 4 MG PO TABS: 4.0000 mg | ORAL_TABLET | ORAL | Status: DC | PRN

## 2011-09-26 MED ORDER — BACITRACIN ZINC 500 UNIT/GM EX OINT
1.0000 "application " | TOPICAL_OINTMENT | Freq: Three times a day (TID) | CUTANEOUS | Status: DC
  Filled 2011-09-26: qty 15

## 2011-09-26 MED ORDER — CARVEDILOL 12.5 MG PO TABS
12.5000 mg | ORAL_TABLET | Freq: Two times a day (BID) | ORAL | Status: DC
  Administered 2011-09-27: 12.5 mg via ORAL
  Filled 2011-09-26 (×3): qty 1

## 2011-09-26 MED ORDER — HYDROMORPHONE HCL PF 1 MG/ML IJ SOLN
1.0000 mg | INTRAMUSCULAR | Status: DC | PRN
  Administered 2011-09-27: 0.5 mg via INTRAVENOUS
  Filled 2011-09-26: qty 1

## 2011-09-26 MED ORDER — MIDAZOLAM HCL 2 MG/2ML IJ SOLN: 0.5000 mg | Freq: Once | INTRAMUSCULAR | Status: DC | PRN

## 2011-09-26 MED ORDER — 0.9 % SODIUM CHLORIDE (POUR BTL) OPTIME
TOPICAL | Status: DC | PRN
  Administered 2011-09-26: 1000 mL

## 2011-09-26 MED ORDER — RALOXIFENE HCL 60 MG PO TABS
60.0000 mg | ORAL_TABLET | Freq: Every day | ORAL | Status: DC
  Administered 2011-09-27: 60 mg via ORAL
  Filled 2011-09-26 (×2): qty 1

## 2011-09-26 MED ORDER — KETOROLAC TROMETHAMINE 30 MG/ML IJ SOLN
15.0000 mg | Freq: Once | INTRAMUSCULAR | Status: AC | PRN
  Administered 2011-09-26: 15 mg via INTRAVENOUS

## 2011-09-26 MED ORDER — HYDROCODONE-ACETAMINOPHEN 7.5-500 MG/15ML PO SOLN
10.0000 mL | ORAL | Status: DC | PRN
  Administered 2011-09-26: 15 mL via ORAL
  Administered 2011-09-27 (×2): 10 mL via ORAL
  Filled 2011-09-26 (×3): qty 15

## 2011-09-26 MED ORDER — FENTANYL CITRATE 0.05 MG/ML IJ SOLN
INTRAMUSCULAR | Status: DC | PRN
  Administered 2011-09-26 (×2): 50 ug via INTRAVENOUS
  Administered 2011-09-26: 150 ug via INTRAVENOUS

## 2011-09-26 MED ORDER — ALISKIREN FUMARATE 150 MG PO TABS
300.0000 mg | ORAL_TABLET | Freq: Every day | ORAL | Status: DC
Start: 2011-09-26 — End: 2011-09-27
  Administered 2011-09-27: 300 mg via ORAL
  Filled 2011-09-26 (×2): qty 2

## 2011-09-26 MED ORDER — LACTATED RINGERS IV SOLN
INTRAVENOUS | Status: DC | PRN
  Administered 2011-09-26: 10:00:00 via INTRAVENOUS

## 2011-09-26 MED ORDER — PROPOFOL 10 MG/ML IV EMUL
INTRAVENOUS | Status: DC | PRN
  Administered 2011-09-26: 120 mg via INTRAVENOUS

## 2011-09-26 MED ORDER — ESMOLOL HCL 10 MG/ML IV SOLN
INTRAVENOUS | Status: DC | PRN
  Administered 2011-09-26: 10 mg via INTRAVENOUS
  Administered 2011-09-26: 20 mg via INTRAVENOUS

## 2011-09-26 MED ORDER — GLYCOPYRROLATE 0.2 MG/ML IJ SOLN
INTRAMUSCULAR | Status: DC | PRN
  Administered 2011-09-26: 0.4 mg via INTRAVENOUS

## 2011-09-26 MED ORDER — PROMETHAZINE HCL 25 MG/ML IJ SOLN: 6.2500 mg | INTRAMUSCULAR | Status: DC | PRN

## 2011-09-26 MED ORDER — CLINDAMYCIN PHOSPHATE 600 MG/50ML IV SOLN
600.0000 mg | Freq: Three times a day (TID) | INTRAVENOUS | Status: DC
  Administered 2011-09-26 – 2011-09-27 (×3): 600 mg via INTRAVENOUS
  Filled 2011-09-26 (×5): qty 50

## 2011-09-26 MED ORDER — MEPERIDINE HCL 25 MG/ML IJ SOLN: 6.2500 mg | INTRAMUSCULAR | Status: DC | PRN

## 2011-09-26 MED ORDER — LACTATED RINGERS IV SOLN
INTRAVENOUS | Status: DC
  Administered 2011-09-26: 09:00:00 via INTRAVENOUS
  Administered 2011-09-27: 50 mL/h via INTRAVENOUS

## 2011-09-26 MED ORDER — CLINDAMYCIN PHOSPHATE 600 MG/50ML IV SOLN
INTRAVENOUS | Status: DC | PRN
  Administered 2011-09-26: 600 mg via INTRAVENOUS

## 2011-09-26 MED ORDER — ROCURONIUM BROMIDE 100 MG/10ML IV SOLN
INTRAVENOUS | Status: DC | PRN
  Administered 2011-09-26: 30 mg via INTRAVENOUS

## 2011-09-26 SURGICAL SUPPLY — 38 items
BLADE SURG 15 STRL LF DISP TIS (BLADE) IMPLANT
BLADE SURG 15 STRL SS (BLADE)
CLEANER TIP ELECTROSURG 2X2 (MISCELLANEOUS) ×2 IMPLANT
CLOTH BEACON ORANGE TIMEOUT ST (SAFETY) ×2 IMPLANT
CONT SPEC 4OZ CLIKSEAL STRL BL (MISCELLANEOUS) ×2 IMPLANT
COVER SURGICAL LIGHT HANDLE (MISCELLANEOUS) ×2 IMPLANT
COVER TABLE BACK 60X90 (DRAPES) ×1 IMPLANT
CRADLE DONUT ADULT HEAD (MISCELLANEOUS) ×1 IMPLANT
ELECT COATED BLADE 2.86 ST (ELECTRODE) ×2 IMPLANT
ELECT REM PT RETURN 9FT ADLT (ELECTROSURGICAL) ×2
ELECTRODE REM PT RTRN 9FT ADLT (ELECTROSURGICAL) ×1 IMPLANT
GAUZE SPONGE 4X4 16PLY XRAY LF (GAUZE/BANDAGES/DRESSINGS) IMPLANT
GLOVE BIO SURGEON STRL SZ7 (GLOVE) ×1 IMPLANT
GLOVE BIOGEL PI IND STRL 6.5 (GLOVE) IMPLANT
GLOVE BIOGEL PI IND STRL 7.0 (GLOVE) IMPLANT
GLOVE BIOGEL PI INDICATOR 6.5 (GLOVE) ×1
GLOVE BIOGEL PI INDICATOR 7.0 (GLOVE) ×1
GLOVE ECLIPSE 7.5 STRL STRAW (GLOVE) ×2 IMPLANT
GLOVE SURG SS PI 6.5 STRL IVOR (GLOVE) ×1 IMPLANT
GLOVE SURG SS PI 7.0 STRL IVOR (GLOVE) ×2 IMPLANT
GOWN STRL NON-REIN LRG LVL3 (GOWN DISPOSABLE) ×6 IMPLANT
GUARD TEETH (MISCELLANEOUS) IMPLANT
KIT BASIN OR (CUSTOM PROCEDURE TRAY) ×2 IMPLANT
KIT ROOM TURNOVER OR (KITS) ×2 IMPLANT
NS IRRIG 1000ML POUR BTL (IV SOLUTION) ×2 IMPLANT
PAD ARMBOARD 7.5X6 YLW CONV (MISCELLANEOUS) ×3 IMPLANT
PENCIL FOOT CONTROL (ELECTRODE) ×2 IMPLANT
SPECIMEN JAR SMALL (MISCELLANEOUS) ×1 IMPLANT
SURGILUBE 2OZ TUBE FLIPTOP (MISCELLANEOUS) IMPLANT
SUT SILK 2 0 SH CR/8 (SUTURE) IMPLANT
SUT SILK 3 0 (SUTURE) ×2
SUT SILK 3-0 18XBRD TIE 12 (SUTURE) IMPLANT
SUT VIC AB 3-0 FS2 27 (SUTURE) ×14 IMPLANT
TOWEL OR 17X24 6PK STRL BLUE (TOWEL DISPOSABLE) IMPLANT
TOWEL OR 17X26 10 PK STRL BLUE (TOWEL DISPOSABLE) ×2 IMPLANT
TRAY ENT MC OR (CUSTOM PROCEDURE TRAY) ×2 IMPLANT
TUBE CONNECTING 12X1/4 (SUCTIONS) ×1 IMPLANT
WATER STERILE IRR 1000ML POUR (IV SOLUTION) ×1 IMPLANT

## 2011-09-26 NOTE — Op Note (Signed)
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 Estimated blood loss approximately 71 cc 78 year old with a mass in the left tongue that was biopsy and confirmation of squamous cell carcinoma. We discussed the procedure of removal of the tumor. She also was given information regarding radiation oncology and radiation treatments. She elected not to proceed with a neck dissection even after discussion of the risk of metastatic disease to the neck. She would prefer to follow that closely. We discussed the procedure including risks, benefits, and options. All questions are answered and consent was obtained. Procedure: Patient was taken to the operating room placed in the supine position after general endotracheal tube anesthesia was draped in the usual sterile manner. The tongue was grasped with a towel clip in the midline and then retracted. An incision was made around the tumor with a 1.5 cm margin circumferentially. The electrocautery was used to do to dissected cutting through the tongue and this portion of the tongue was removed marked with suture for anatomic identification and sent for frozen section. The frozen section had negative margins. The wound was irrigated with saline and closed with interrupted 3-0 Vicryl sutures. The oral cavity oropharynx is suctioned out of all blood and debris. The patient was then awakened brought to cover stable condition counts correct

## 2011-09-26 NOTE — Anesthesia Postprocedure Evaluation (Signed)
Anesthesia Post Note  Patient: Stephanie [name]  Procedure(s) Performed: Procedure(s) (LRB): HEMIGLOSSECTOMY (Left)  Anesthesia type: GA  Patient location: PACU  Post pain: Pain level controlled  Post assessment: Post-op Vital signs reviewed  Last Vitals:  Filed Vitals:   09/26/11 1145  BP: 180/78  Pulse: 75  Temp:   Resp: 14    Post vital signs: Reviewed  Level of consciousness: sedated  Complications: No apparent anesthesia complications

## 2011-09-26 NOTE — Transfer of Care (Signed)
Immediate Anesthesia Transfer of Care Note  Patient: Stephanie [name]  Procedure(s) Performed: Procedure(s) (LRB): HEMIGLOSSECTOMY (Left)  Patient Location: PACU  Anesthesia Type: General  Level of Consciousness: awake, alert , oriented and sedated  Airway & Oxygen Therapy: Patient Spontanous Breathing and Patient connected to nasal cannula oxygen  Post-op Assessment: Report given to PACU RN, Post -op Vital signs reviewed and stable and Patient moving all extremities  Post vital signs: Reviewed and stable  Complications: No apparent anesthesia complications

## 2011-09-26 NOTE — Preoperative (Signed)
Beta Blockers   Reason not to administer Beta Blockers:Not Applicable 

## 2011-09-26 NOTE — Progress Notes (Signed)
Patient ID: Stephanie [name], female   DOB: October 19, 1925, 78 y.o.   MRN: [id number] Awake and alert. Able to speak and breathing without any difficulty. Tongue suture line intact without swelling or hematoma. No other abnormal findings. Continue overnight care.

## 2011-09-26 NOTE — H&P (Signed)
Stephanie [name] is an 78 y.o. female.   Chief Complaint: Left tongue mass HPI: 78 year old who's had a mass in the left tongue that his enlarged. She was seen by her oral surgeon and a biopsy was performed which was diagnosed as squamous cell carcinoma. She had a CT scan which showed no obvious mass in the tongue but it did show some lymph nodes in the neck that were not pathologic. We discussed neck dissection but she did not want to proceed with this despite knowing the risk of metastatic disease to the neck. She is now ready to proceed with the left tongue resection.  Past Medical History  Diagnosis Date  . Tongue cancer 09/11/11    squamous cell carcinoma  . Cancer 09/11/11    tongue-squamous cell  . Arthritis   . Hypertension   . Blood transfusion     Last hip surgery.    Past Surgical History  Procedure Date  . Cataract surgery   . Total hip arthroplasty     bialt  . [name] tooth extraction   . Tubal ligation   . Appendectomy     w/BTL    Family History  Problem Relation Age of Onset  . Heart attack Mother   . Cancer Father     leukemia  . Cancer Brother     prostate, brain tumor,lung  . Diabetes Maternal Uncle   . Diabetes Maternal Grandfather   . Cancer Daughter     NHL   Social History:  reports that she quit smoking about 55 years ago. She does not have any smokeless tobacco history on file. She reports that she does not drink alcohol or use illicit drugs.  Allergies:  Allergies  Allergen Reactions  . Morphine And Related     Pt states she has never taken but will not take due to husband's severe reaction to Morphine.    No current facility-administered medications on file as of 09/26/2011.   Medications Prior to Admission  Medication Sig Dispense Refill  . acetaminophen (TYLENOL) 500 MG tablet Take 500 mg by mouth every 6 (six) hours as needed. For pain      . aliskiren (TEKTURNA) 300 MG tablet Take 300 mg by mouth daily.      Marland Kitchen amLODipine (NORVASC) 5 MG  tablet Take 5 mg by mouth daily.      . carvedilol (COREG) 6.25 MG tablet Take 12.5 mg by mouth 2 (two) times daily at 10 am and 4 pm.      . cholecalciferol (VITAMIN D) 1000 UNITS tablet Take 1,000 Units by mouth 2 (two) times daily before lunch and supper.      Marland Kitchen HYDROcodone-acetaminophen (NORCO) 5-325 MG per tablet Take 1 tablet by mouth every 8 (eight) hours as needed. For pain      . Multiple Vitamin (MULTIVITAMIN) tablet Take 1 tablet by mouth daily.      . potassium chloride SA (K-DUR,KLOR-CON) 20 MEQ tablet Take 20 mEq by mouth daily.      . raloxifene (EVISTA) 60 MG tablet Take 60 mg by mouth daily.      . ramipril (ALTACE) 10 MG capsule Take 10 mg by mouth daily.      . vitamin C (ASCORBIC ACID) 500 MG tablet Take 500 mg by mouth daily.      . cephALEXin (KEFLEX) 500 MG capsule Take 500 mg by mouth every 6 (six) hours.        Results for orders placed during the hospital encounter  of 09/24/11 (from the past 48 hour(s))  BASIC METABOLIC PANEL     Status: Abnormal   Collection Time   09/24/11  2:13 PM      Component Value Range Comment   Sodium 143  135 - 145 (mEq/L)    Potassium 4.0  3.5 - 5.1 (mEq/L)    Chloride 107  96 - 112 (mEq/L)    CO2 28  19 - 32 (mEq/L)    Glucose, Bld 130 (*) 70 - 99 (mg/dL)    BUN 22  6 - 23 (mg/dL)    Creatinine, Ser 1.61  0.50 - 1.10 (mg/dL)    Calcium 9.4  8.4 - 10.5 (mg/dL)    GFR calc non Af Amer 79 (*) >90 (mL/min)    GFR calc Af Amer >90  >90 (mL/min)   CBC     Status: Normal   Collection Time   09/24/11  2:13 PM      Component Value Range Comment   WBC 8.3  4.0 - 10.5 (K/uL)    RBC 4.51  3.87 - 5.11 (MIL/uL)    Hemoglobin 12.7  12.0 - 15.0 (g/dL)    HCT 09.6  04.5 - 40.9 (%)    MCV 87.6  78.0 - 100.0 (fL)    MCH 28.2  26.0 - 34.0 (pg)    MCHC 32.2  30.0 - 36.0 (g/dL)    RDW 81.1  91.4 - 78.2 (%)    Platelets 267  150 - 400 (K/uL)   SURGICAL PCR SCREEN     Status: Normal   Collection Time   09/24/11  2:13 PM      Component Value Range  Comment   MRSA, PCR NEGATIVE  NEGATIVE     Staphylococcus aureus NEGATIVE  NEGATIVE     Dg Chest 2 View  09/24/2011  *RADIOLOGY REPORT*  Clinical Data: Preop for hemiglossectomy.  Left tongue mass. History of hypertension.  CHEST - 2 VIEW  Comparison: 07/17 as does have a  Findings: The heart is enlarged.  There are mild perihilar bronchitic changes.  No focal consolidations or pleural effusions are identified.  There are moderate degenerative changes in both shoulders, associated with subacromial narrowing.  Within the thoracolumbar spine, there is apparent retrolisthesis of L1 on L2 and L2 on L3 versus positioning.  IMPRESSION:  1.  Cardiomegaly and bronchitic changes. 2. No focal pulmonary abnormality. 3.  Retrolisthesis of the upper lumbar spine versus positioning. 4.  Degenerative changes in both shoulders.  Original Report Authenticated By: Patterson Hammersmith, M.D.    Review of Systems  Constitutional: Negative.   HENT: Negative.   Eyes: Negative.   Respiratory: Negative.   Cardiovascular: Negative.   Skin: Negative.     There were no vitals taken for this visit. Physical Exam  Constitutional: She appears well-nourished.  HENT:  Nose: Nose normal.  Mouth/Throat: No oropharyngeal exudate.       There is a approximately 2 cm left lateral tongue mass that is squamous cell carcinoma.  Eyes: Pupils are equal, round, and reactive to light.  Neck: Normal range of motion.  Cardiovascular: Normal rate.   Respiratory: Effort normal.     Assessment/Plan Left tongue mass-she has had the procedure discussed and she understands the cancer care and issues with neck dissection. She does not want to proceed with a neck dissection but only with the resection of the primary tumor. She does understand that this will require very close followup if this is the format and  plan that she wants to proceed with.  [name] 09/26/2011, 8:12 AM

## 2011-09-26 NOTE — Anesthesia Preprocedure Evaluation (Signed)
Anesthesia Evaluation  Patient identified by MRN, date of birth, ID band Patient awake    Reviewed: Allergy & Precautions, H&P , Patient's Chart, lab work & pertinent test results, reviewed documented beta blocker date and time   History of Anesthesia Complications Negative for: history of anesthetic complications  Airway Mallampati: II TM Distance: >3 FB Neck ROM: full    Dental No notable dental hx.    Pulmonary neg pulmonary ROS,  breath sounds clear to auscultation  Pulmonary exam normal       Cardiovascular Exercise Tolerance: Good hypertension, negative cardio ROS  Rhythm:regular Rate:Normal     Neuro/Psych negative neurological ROS  negative psych ROS   GI/Hepatic negative GI ROS, Neg liver ROS,   Endo/Other  negative endocrine ROS  Renal/GU negative Renal ROS     Musculoskeletal   Abdominal   Peds  Hematology negative hematology ROS (+)   Anesthesia Other Findings Tongue cancer 09/11/11 squamous cell carcinoma Cancer 09/11/11 tongue-squamous cell    Arthritis     Hypertension        Blood transfusion    Reproductive/Obstetrics negative OB ROS                           Anesthesia Physical Anesthesia Plan  ASA: II  Anesthesia Plan: General ETT   Post-op Pain Management:    Induction:   Airway Management Planned:   Additional Equipment:   Intra-op Plan:   Post-operative Plan:   Informed Consent: I have reviewed the patients History and Physical, chart, labs and discussed the procedure including the risks, benefits and alternatives for the proposed anesthesia with the patient or authorized representative who has indicated his/her understanding and acceptance.   Dental Advisory Given  Plan Discussed with: CRNA and Surgeon  Anesthesia Plan Comments:         Anesthesia Quick Evaluation

## 2011-09-26 NOTE — Anesthesia Procedure Notes (Addendum)
Performed by: Donette Larry E   Procedure Name: Intubation Date/Time: 09/26/2011 9:58 AM Performed by: Fransisca Kaufmann Pre-anesthesia Checklist: Patient identified, Emergency Drugs available, Suction available, Patient being monitored and Timeout performed Patient Re-evaluated:Patient Re-evaluated prior to inductionOxygen Delivery Method: Circle system utilized Preoxygenation: Pre-oxygenation with 100% oxygen Intubation Type: IV induction Ventilation: Mask ventilation without difficulty Laryngoscope Size: Mac and 3 Grade View: Grade II Tube type: Oral Tube size: 6.5 mm Airway Equipment and Method: Stylet Placement Confirmation: ETT inserted through vocal cords under direct vision,  positive ETCO2 and breath sounds checked- equal and bilateral Secured at: 22 cm Tube secured with: Tape Dental Injury: Teeth and Oropharynx as per pre-operative assessment

## 2011-09-27 MED ORDER — CLINDAMYCIN HCL 300 MG PO CAPS: 300.0000 mg | ORAL_CAPSULE | Freq: Three times a day (TID) | ORAL | Status: AC

## 2011-09-27 NOTE — Progress Notes (Signed)
She is doing very well. She is taking fluids without coughing or having any issues with swallowing. The pain is well controlled of her tongue and has only required one hydrocodone pill since admission. Examination-the tongue is mobile and the sutures are intact. There is no excessive swelling. Neck is without swelling or adenopathy. She is breathing well. She has an excellent response to the surgery and since she looks excellent and taking fluids well she will be discharged to home. She'll followup next week.

## 2011-09-27 NOTE — Progress Notes (Addendum)
Discharge instructions reviewed with pt and pt's daughter and prescription given.  Pt and pt's daughter verbalized understanding and questions answered.  Pt discharged in stable condition via wheelchair with daughter.  Stephanie [name]

## 2011-09-27 NOTE — Discharge Summary (Signed)
Physician Discharge Summary  Patient ID: Stephanie [name] MRN: [id number] DOB/AGE: [birth year] 76 y.o.  Admit date: 09/26/2011 Discharge date: 09/27/2011  Admission Diagnoses: Left tongue cancer  Discharge Diagnoses: Left tongue cancer Active Problems:  * No active hospital problems. *    Discharged Condition: good  Hospital Course: She was admitted after undergoing left hemiglossectomy. She did very well with very minimal amount of pain. She was swallowing liquids well with no coughing or aspiration symptoms. She is very happy and ready to go home on postop day 1. She was discharged to home to followup in one week. Consults: None  Significant Diagnostic Studies: None  Treatments: surgery: Left hemiglossectomy  Discharge Exam: Blood pressure 150/58, pulse 71, temperature 97.9 F (36.6 C), temperature source Oral, resp. rate 16, height 4\' 11"  (1.499 m), weight 58.6 kg (129 lb 3 oz), SpO2 96.00%. Awake and alert, very happy with the a very good disposition Nose is open. Oral cavity/oropharynx-the looks excellent with sutures intact and no evidence of excessive swelling of any of the pharynx. Neck no swelling or adenopathy or tenderness Lungs clear Cardiovascular regular rate No excessive swelling in extremities or tenderness  Disposition:   Discharge Orders    Future Appointments: Provider: Department: Dept Phone: Center:   10/28/2011 3:00 PM [facility] [phone] None   10/28/2011 3:30 PM Jonna Coup, MD [facility] [phone] None     Future Orders Please Complete By Expires   Diet - low sodium heart healthy      Increase activity slowly      Discharge instructions      Comments:   Patient is to followup in one week sometime next week. Call if there's any difficulties with the tongue or swallowing. See below reasons for to contact Dr. but mostly if anything is different than it is on discharge.   Call MD for:  temperature >100.4      Call MD for:  persistant nausea and vomiting      Call MD for:  severe uncontrolled pain      Call MD for:  redness, tenderness, or signs of infection (pain, swelling, redness, odor or green/yellow discharge around incision site)      Call MD for:  difficulty breathing, headache or visual disturbances      Call MD for:  hives      Call MD for:  persistant dizziness or light-headedness      Call MD for:  extreme fatigue        Medication List  As of 09/27/2011 11:38 AM   STOP taking these medications         acetaminophen 500 MG tablet      Calcium 500-125 MG-UNIT Tabs      cephALEXin 500 MG capsule      cholecalciferol 1000 UNITS tablet      HYDROcodone-acetaminophen 5-325 MG per tablet      multivitamin tablet      OVER THE COUNTER MEDICATION      potassium chloride SA 20 MEQ tablet      Vitamin A & D 10000-400 UNITS Caps      vitamin C 500 MG tablet         TAKE these medications         aliskiren 300 MG tablet   Commonly known as: TEKTURNA   Take 300 mg by mouth daily.      amLODipine 5 MG tablet   Commonly known as: NORVASC   Take 5 mg  by mouth daily.      carvedilol 6.25 MG tablet   Commonly known as: COREG   Take 12.5 mg by mouth 2 (two) times daily at 10 am and 4 pm.      clindamycin 300 MG capsule   Commonly known as: CLEOCIN   Take 1 capsule (300 mg total) by mouth 3 (three) times daily.      raloxifene 60 MG tablet   Commonly known as: EVISTA   Take 60 mg by mouth daily.      ramipril 10 MG capsule   Commonly known as: ALTACE   Take 10 mg by mouth daily.             SignedSuzanna Obey 09/27/2011, 11:38 AM

## 2011-09-29 DIAGNOSIS — C023 Malignant neoplasm of anterior two-thirds of tongue, part unspecified: Secondary | ICD-10-CM | POA: Insufficient documentation

## 2011-09-29 NOTE — Progress Notes (Signed)
[facility] Radiation Oncology NEW PATIENT EVALUATION  Name: Stephanie [name] MRN: [id number]  Date: 09/25/2011  DOB: May 23, 1926  Status: outpatient   CC: No primary provider on file.  Stephanie Obey, MD    REFERRING PHYSICIAN: [name], MD   DIAGNOSIS: The encounter diagnosis was Tongue cancer.    HISTORY OF PRESENT ILLNESS:  Stephanie [name] is a 78 y.o. female who is  Seen today for an initial consultation visit. The patient noticed a lesion along the left lateral tongue approximately 4-6 weeks ago. The patient denies any significant pain with this area and also denied any trauma associated with this. She has been worked up and an Transport planner has performed an incisional biopsy which did return positive for well-differentiated squamous cell carcinoma. She denies any noted within her neck and denies any other issues related to the head and neck region such as dysphagia or odynophagia. The patient has been seen by Dr. [name] and she is scheduled for surgery on 09/26/2011. I been asked to see her today for discussion of possible radiation treatment as well.  The patient did have a CT scan of the neck performed on 09/19/2011. The primary tongue mass was not visualized, likely due to metal artifact. There were at least 3 ovoid nose which raised the possibility of metastatic disease although the morphology suggest that these are benign. There were no other focal mucosal lesions.  PREVIOUS RADIATION THERAPY: No   PAST MEDICAL HISTORY:  has a past medical history of Tongue cancer (09/11/11); Cancer (09/11/11); Arthritis; Hypertension; and Blood transfusion.     PAST SURGICAL HISTORY:  Past Surgical History  Procedure Date  . Total hip arthroplasty     bialt  . Wisdom tooth extraction   . Tubal ligation   . Appendectomy     w/BTL  . Hemiglossectomy 09/26/2011     FAMILY HISTORY: family history includes Cancer in her brother, daughter, and father; Diabetes in her maternal  grandfather and maternal uncle; and Heart attack in her mother.  There is no history of Anesthesia problems.   SOCIAL HISTORY:  reports that she quit smoking about 55 years ago. She has never used smokeless tobacco. She reports that she does not drink alcohol or use illicit drugs.   ALLERGIES: Morphine and related   MEDICATIONS:  Current Outpatient Prescriptions  Medication Sig Dispense Refill  . aliskiren (TEKTURNA) 300 MG tablet Take 300 mg by mouth daily.      [name] amLODipine (NORVASC) 5 MG tablet Take 5 mg by mouth daily.      . carvedilol (COREG) 6.25 MG tablet Take 12.5 mg by mouth 2 (two) times daily at 10 am and 4 pm.      . clindamycin (CLEOCIN) 300 MG capsule Take 1 capsule (300 mg total) by mouth 3 (three) times daily.  21 capsule  0  . raloxifene (EVISTA) 60 MG tablet Take 60 mg by mouth daily.      . ramipril (ALTACE) 10 MG capsule Take 10 mg by mouth daily.         REVIEW OF SYSTEMS:  A comprehensive review of systems was negative.    PHYSICAL EXAM:  height is 4' 11.5" (1.511 m) and weight is 129 lb 4.8 oz (58.65 kg). Her oral temperature is 98.3 F (36.8 C). Her blood pressure is 140/70 and her pulse is 69. Her respiration is 20.   General: Well-developed female in no acute distress HEENT: Normocephalic, atraumatic; oral cavity shows an approximate 2 CM  lesion along the lateral aspect of the tongue. No other lesions seen within the oral cavity and no additional lesion seen within the oropharynx on mirror exam. Neck: Supple without any lymphadenopathy Cardiovascular: Regular rate and rhythm Respiratory: Clear to auscultation bilaterally GI: Soft, nontender, normal bowel sounds Extremities: No edema present Neuro: No focal deficits    LABORATORY DATA:  Lab Results  Component Value Date   WBC 8.3 09/24/2011   HGB 12.7 09/24/2011   HCT 39.5 09/24/2011   MCV 87.6 09/24/2011   PLT 267 09/24/2011   Lab Results  Component Value Date   NA 143 09/24/2011   K 4.0 09/24/2011    CL 107 09/24/2011   CO2 28 09/24/2011   Lab Results  Component Value Date   ALT 24 12/31/2006   AST 27 12/31/2006   ALKPHOS 71 12/31/2006   BILITOT 0.6 12/31/2006      IMPRESSION: The patient is a pleasant 78 year old female who has a recent diagnosis of 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 Clinically this appears to correspond to a large T1, N0, M0 tumor.   PLAN: The patient is scheduled to proceed with resection on 09/26/2011. I believe that this is very reasonable and I discussed surgery as the primary modality which I would recommend for this location and presentation.  I did discuss with the patient the potential role of radiation treatment in such a setting. We discussed how radiation can be used in a definitive manner although again surgery would be my recommendation. We also discussed the role potentially of a neck dissection. Additionally we discussed the possible role of postoperative radiation treatment and she is aware that a final decision would be based on her final pathology. I would like therefore to see the patient back for followup after her upcoming surgical resection. We can review her final pathology at that time and see you she meets any appropriate criteria for postoperative radiation, also taking into account her health status and age. All the patient's questions were answered.  I spent 60 minutes minutes face to face with the patient and more than 50% of that time was spent in counseling and/or coordination of care.

## 2011-09-29 NOTE — Progress Notes (Signed)
Encounter addended by: Jonna Coup, MD on: 09/29/2011 11:25 PM<BR>     Documentation filed: Notes Section

## 2011-10-01 ENCOUNTER — Encounter (HOSPITAL_COMMUNITY): Payer: Self-pay | Admitting: Otolaryngology

## 2011-10-25 ENCOUNTER — Encounter: Payer: Self-pay | Admitting: Radiation Oncology

## 2011-10-28 ENCOUNTER — Ambulatory Visit
Admission: RE | Admit: 2011-10-28 | Discharge: 2011-10-28 | Disposition: A | Discharge status: Home / Self Care | Payer: Medicare Other | Source: Ambulatory Visit | Attending: Radiation Oncology | Admitting: Radiation Oncology

## 2011-10-28 ENCOUNTER — Encounter: Payer: Self-pay | Admitting: Radiation Oncology

## 2011-10-28 VITALS — BP 145/68 | HR 67 | Temp 98.2°F | Resp 20 | Wt 125.0 lb

## 2011-10-28 DIAGNOSIS — C023 Malignant neoplasm of anterior two-thirds of tongue, part unspecified: Secondary | ICD-10-CM | POA: Diagnosis not present

## 2011-10-28 DIAGNOSIS — C021 Malignant neoplasm of border of tongue: Secondary | ICD-10-CM | POA: Diagnosis not present

## 2011-10-28 DIAGNOSIS — Z87891 Personal history of nicotine dependence: Secondary | ICD-10-CM | POA: Diagnosis not present

## 2011-10-28 DIAGNOSIS — Z79899 Other long term (current) drug therapy: Secondary | ICD-10-CM | POA: Diagnosis not present

## 2011-10-28 DIAGNOSIS — C029 Malignant neoplasm of tongue, unspecified: Secondary | ICD-10-CM

## 2011-10-28 DIAGNOSIS — I10 Essential (primary) hypertension: Secondary | ICD-10-CM | POA: Diagnosis not present

## 2011-10-28 NOTE — Progress Notes (Signed)
Pt states she has had no pain from tongue resection on 09/26/11. She takes Hydrocodone daily for shoulder pain. Eating soft foods w/o difficulty.

## 2011-10-28 NOTE — Progress Notes (Signed)
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  She was seen preoperatively and the plan was for her to proceed with surgical resection with regards to her left-sided cancer of the oral tongue. This was completed on 09/26/2011. The patient states that she has been recovering very well since that time. The patient's pathology returned positive for an invasive well-differentiated squamous cell carcinoma which measured 1.2 cm in maximum dimension. All of the final resection margins were clear. There is no evidence of angiolymphatic invasion and perineural invasion. This tumor corresponded to a peak T1 P. NX tumor.                              ALLERGIES:  is allergic to morphine and related.  Meds: Current Outpatient Prescriptions  Medication Sig Dispense Refill  . acetaminophen (TYLENOL) 325 MG tablet Take 650 mg by mouth every 6 (six) hours as needed.      Marland Kitchen aliskiren (TEKTURNA) 300 MG tablet Take 300 mg by mouth daily.      Marland Kitchen amLODipine (NORVASC) 5 MG tablet Take 5 mg by mouth daily.      . carvedilol (COREG) 6.25 MG tablet Take 12.5 mg by mouth 2 (two) times daily at 10 am and 4 pm.      . HYDROcodone-acetaminophen (NORCO) 5-325 MG per tablet Take 1 tablet by mouth every 6 (six) hours as needed.      . raloxifene (EVISTA) 60 MG tablet Take 60 mg by mouth daily.      . ramipril (ALTACE) 10 MG capsule Take 10 mg by mouth daily.      Marland Kitchen DISCONTD: potassium chloride SA (K-DUR,KLOR-CON) 20 MEQ tablet Take 20 mEq by mouth daily.        Physical Findings: The patient is in no acute distress. Patient is alert and oriented.  weight is 125  lb (56.7 kg). Her oral temperature is 98.2 F (36.8 C). Her blood pressure is 145/68 and her pulse is 67. Her respiration is 20. Marland Kitchen   General: Well-developed female in no acute distress;  Little bit of slurring I believe still present HEENT: Normocephalic, atraumatic; oral cavity looks very good. Left tongue status post resection with 1 visible stage. No sign of recurrent disease. Neck: Supple without any lymphadenopathy Cardiovascular: Regular rate and rhythm Respiratory: Clear to auscultation bilaterally    Lab Findings: Lab Results  Component Value Date   WBC 8.3 09/24/2011   HGB 12.7 09/24/2011   HCT 39.5 09/24/2011   MCV 87.6 09/24/2011   PLT 267 09/24/2011     Radiographic Findings: No results found.  Impression:    78 year old female status post resection of an oral squamous cell carcinoma involving the left tongue. Pathologically this was a T1 tumor and there is not any definitive evidence clinically of nodal disease on initial workup.  Plan:  It appears that the patient's surgery went very well. There are no major high-risk features on final pathology. She is scheduled with Dr. [name] for ongoing close followup which I believe is reasonable. The patient did have some notes  which are a little bit enlarged but the morphology was not felt to be very concerning. Therefore I do believe that ongoing followup is certainly warranted although I don't believe that adjuvant radiotherapy would be sufficiently helpful for her case. The patient was fine with this. I have not scheduled a followup appointment with her but she understands that we would be more than happy to see her at any point if we can be of further assistance.  I spent 15 minutes with the patient today, the majority of which was spent counseling the patient on the diagnosis of cancer and coordinating care.   Radene Gunning, M.D., Ph.D.

## 2011-10-28 NOTE — Progress Notes (Signed)
Please see the Nurse Progress Note in the MD Initial Consult Encounter for this patient. 

## 2011-11-01 NOTE — Progress Notes (Signed)
Encounter addended by: Glennie Hawk, RN on: 11/01/2011  9:40 AM<BR>     Documentation filed: Charges VN

## 2011-11-12 DIAGNOSIS — E785 Hyperlipidemia, unspecified: Secondary | ICD-10-CM | POA: Diagnosis not present

## 2011-11-12 DIAGNOSIS — R7301 Impaired fasting glucose: Secondary | ICD-10-CM | POA: Diagnosis not present

## 2011-11-12 DIAGNOSIS — I10 Essential (primary) hypertension: Secondary | ICD-10-CM | POA: Diagnosis not present

## 2011-11-14 DIAGNOSIS — E785 Hyperlipidemia, unspecified: Secondary | ICD-10-CM | POA: Diagnosis not present

## 2011-11-14 DIAGNOSIS — M810 Age-related osteoporosis without current pathological fracture: Secondary | ICD-10-CM | POA: Diagnosis not present

## 2011-11-14 DIAGNOSIS — I10 Essential (primary) hypertension: Secondary | ICD-10-CM | POA: Diagnosis not present

## 2011-11-14 DIAGNOSIS — M1990 Unspecified osteoarthritis, unspecified site: Secondary | ICD-10-CM | POA: Diagnosis not present

## 2011-11-14 DIAGNOSIS — R7301 Impaired fasting glucose: Secondary | ICD-10-CM | POA: Diagnosis not present

## 2011-11-26 DIAGNOSIS — C029 Malignant neoplasm of tongue, unspecified: Secondary | ICD-10-CM | POA: Diagnosis not present

## 2012-01-09 DIAGNOSIS — D045 Carcinoma in situ of skin of trunk: Secondary | ICD-10-CM | POA: Diagnosis not present

## 2012-01-09 DIAGNOSIS — D485 Neoplasm of uncertain behavior of skin: Secondary | ICD-10-CM | POA: Diagnosis not present

## 2012-01-15 DIAGNOSIS — T148XXA Other injury of unspecified body region, initial encounter: Secondary | ICD-10-CM | POA: Diagnosis not present

## 2012-01-28 DIAGNOSIS — Z85810 Personal history of malignant neoplasm of tongue: Secondary | ICD-10-CM | POA: Diagnosis not present

## 2012-01-29 DIAGNOSIS — D045 Carcinoma in situ of skin of trunk: Secondary | ICD-10-CM | POA: Diagnosis not present

## 2012-02-20 DIAGNOSIS — R7301 Impaired fasting glucose: Secondary | ICD-10-CM | POA: Diagnosis not present

## 2012-02-20 DIAGNOSIS — M810 Age-related osteoporosis without current pathological fracture: Secondary | ICD-10-CM | POA: Diagnosis not present

## 2012-02-20 DIAGNOSIS — I10 Essential (primary) hypertension: Secondary | ICD-10-CM | POA: Diagnosis not present

## 2012-02-25 DIAGNOSIS — R7301 Impaired fasting glucose: Secondary | ICD-10-CM | POA: Diagnosis not present

## 2012-02-25 DIAGNOSIS — M1990 Unspecified osteoarthritis, unspecified site: Secondary | ICD-10-CM | POA: Diagnosis not present

## 2012-02-25 DIAGNOSIS — E785 Hyperlipidemia, unspecified: Secondary | ICD-10-CM | POA: Diagnosis not present

## 2012-02-25 DIAGNOSIS — I10 Essential (primary) hypertension: Secondary | ICD-10-CM | POA: Diagnosis not present

## 2012-02-25 DIAGNOSIS — M810 Age-related osteoporosis without current pathological fracture: Secondary | ICD-10-CM | POA: Diagnosis not present

## 2012-04-21 DIAGNOSIS — L819 Disorder of pigmentation, unspecified: Secondary | ICD-10-CM | POA: Diagnosis not present

## 2012-04-21 DIAGNOSIS — B351 Tinea unguium: Secondary | ICD-10-CM | POA: Diagnosis not present

## 2012-04-21 DIAGNOSIS — L821 Other seborrheic keratosis: Secondary | ICD-10-CM | POA: Diagnosis not present

## 2012-04-21 DIAGNOSIS — D1801 Hemangioma of skin and subcutaneous tissue: Secondary | ICD-10-CM | POA: Diagnosis not present

## 2012-04-21 DIAGNOSIS — D235 Other benign neoplasm of skin of trunk: Secondary | ICD-10-CM | POA: Diagnosis not present

## 2012-04-21 DIAGNOSIS — D485 Neoplasm of uncertain behavior of skin: Secondary | ICD-10-CM | POA: Diagnosis not present

## 2012-04-21 DIAGNOSIS — D239 Other benign neoplasm of skin, unspecified: Secondary | ICD-10-CM | POA: Diagnosis not present

## 2012-04-21 DIAGNOSIS — Z85828 Personal history of other malignant neoplasm of skin: Secondary | ICD-10-CM | POA: Diagnosis not present

## 2012-05-05 ENCOUNTER — Ambulatory Visit
Admission: RE | Admit: 2012-05-05 | Discharge: 2012-05-05 | Disposition: A | Discharge status: Home / Self Care | Payer: Medicare Other | Source: Ambulatory Visit | Attending: Otolaryngology | Admitting: Otolaryngology

## 2012-05-05 ENCOUNTER — Other Ambulatory Visit: Payer: Self-pay | Admitting: Otolaryngology

## 2012-05-05 DIAGNOSIS — C029 Malignant neoplasm of tongue, unspecified: Secondary | ICD-10-CM

## 2012-05-05 DIAGNOSIS — Z85810 Personal history of malignant neoplasm of tongue: Secondary | ICD-10-CM | POA: Diagnosis not present

## 2012-05-28 DIAGNOSIS — R7301 Impaired fasting glucose: Secondary | ICD-10-CM | POA: Diagnosis not present

## 2012-05-28 DIAGNOSIS — I10 Essential (primary) hypertension: Secondary | ICD-10-CM | POA: Diagnosis not present

## 2012-05-28 DIAGNOSIS — M810 Age-related osteoporosis without current pathological fracture: Secondary | ICD-10-CM | POA: Diagnosis not present

## 2012-06-02 DIAGNOSIS — R7301 Impaired fasting glucose: Secondary | ICD-10-CM | POA: Diagnosis not present

## 2012-06-02 DIAGNOSIS — Z23 Encounter for immunization: Secondary | ICD-10-CM | POA: Diagnosis not present

## 2012-06-02 DIAGNOSIS — M1990 Unspecified osteoarthritis, unspecified site: Secondary | ICD-10-CM | POA: Diagnosis not present

## 2012-06-02 DIAGNOSIS — I10 Essential (primary) hypertension: Secondary | ICD-10-CM | POA: Diagnosis not present

## 2012-06-02 DIAGNOSIS — E785 Hyperlipidemia, unspecified: Secondary | ICD-10-CM | POA: Diagnosis not present

## 2012-06-02 DIAGNOSIS — M810 Age-related osteoporosis without current pathological fracture: Secondary | ICD-10-CM | POA: Diagnosis not present

## 2012-06-25 DIAGNOSIS — Z1231 Encounter for screening mammogram for malignant neoplasm of breast: Secondary | ICD-10-CM | POA: Diagnosis not present

## 2012-08-11 DIAGNOSIS — C029 Malignant neoplasm of tongue, unspecified: Secondary | ICD-10-CM | POA: Diagnosis not present

## 2012-09-01 DIAGNOSIS — R7301 Impaired fasting glucose: Secondary | ICD-10-CM | POA: Diagnosis not present

## 2012-09-01 DIAGNOSIS — E785 Hyperlipidemia, unspecified: Secondary | ICD-10-CM | POA: Diagnosis not present

## 2012-09-01 DIAGNOSIS — I10 Essential (primary) hypertension: Secondary | ICD-10-CM | POA: Diagnosis not present

## 2012-09-03 DIAGNOSIS — I10 Essential (primary) hypertension: Secondary | ICD-10-CM | POA: Diagnosis not present

## 2012-09-03 DIAGNOSIS — E785 Hyperlipidemia, unspecified: Secondary | ICD-10-CM | POA: Diagnosis not present

## 2012-09-03 DIAGNOSIS — M810 Age-related osteoporosis without current pathological fracture: Secondary | ICD-10-CM | POA: Diagnosis not present

## 2012-09-03 DIAGNOSIS — M1990 Unspecified osteoarthritis, unspecified site: Secondary | ICD-10-CM | POA: Diagnosis not present

## 2012-09-03 DIAGNOSIS — R7301 Impaired fasting glucose: Secondary | ICD-10-CM | POA: Diagnosis not present

## 2012-11-10 DIAGNOSIS — Z85810 Personal history of malignant neoplasm of tongue: Secondary | ICD-10-CM | POA: Diagnosis not present

## 2012-12-01 DIAGNOSIS — R7301 Impaired fasting glucose: Secondary | ICD-10-CM | POA: Diagnosis not present

## 2012-12-01 DIAGNOSIS — I10 Essential (primary) hypertension: Secondary | ICD-10-CM | POA: Diagnosis not present

## 2012-12-03 DIAGNOSIS — I10 Essential (primary) hypertension: Secondary | ICD-10-CM | POA: Diagnosis not present

## 2012-12-03 DIAGNOSIS — E785 Hyperlipidemia, unspecified: Secondary | ICD-10-CM | POA: Diagnosis not present

## 2012-12-03 DIAGNOSIS — M810 Age-related osteoporosis without current pathological fracture: Secondary | ICD-10-CM | POA: Diagnosis not present

## 2012-12-03 DIAGNOSIS — R7301 Impaired fasting glucose: Secondary | ICD-10-CM | POA: Diagnosis not present

## 2012-12-03 DIAGNOSIS — M1990 Unspecified osteoarthritis, unspecified site: Secondary | ICD-10-CM | POA: Diagnosis not present

## 2013-03-09 DIAGNOSIS — Z85810 Personal history of malignant neoplasm of tongue: Secondary | ICD-10-CM | POA: Diagnosis not present

## 2013-03-16 DIAGNOSIS — R7301 Impaired fasting glucose: Secondary | ICD-10-CM | POA: Diagnosis not present

## 2013-03-16 DIAGNOSIS — E785 Hyperlipidemia, unspecified: Secondary | ICD-10-CM | POA: Diagnosis not present

## 2013-03-16 DIAGNOSIS — M810 Age-related osteoporosis without current pathological fracture: Secondary | ICD-10-CM | POA: Diagnosis not present

## 2013-03-16 DIAGNOSIS — I10 Essential (primary) hypertension: Secondary | ICD-10-CM | POA: Diagnosis not present

## 2013-03-18 DIAGNOSIS — R7301 Impaired fasting glucose: Secondary | ICD-10-CM | POA: Diagnosis not present

## 2013-03-18 DIAGNOSIS — I10 Essential (primary) hypertension: Secondary | ICD-10-CM | POA: Diagnosis not present

## 2013-03-18 DIAGNOSIS — M810 Age-related osteoporosis without current pathological fracture: Secondary | ICD-10-CM | POA: Diagnosis not present

## 2013-03-18 DIAGNOSIS — E785 Hyperlipidemia, unspecified: Secondary | ICD-10-CM | POA: Diagnosis not present

## 2013-03-18 DIAGNOSIS — M1990 Unspecified osteoarthritis, unspecified site: Secondary | ICD-10-CM | POA: Diagnosis not present

## 2013-04-27 DIAGNOSIS — Z85828 Personal history of other malignant neoplasm of skin: Secondary | ICD-10-CM | POA: Diagnosis not present

## 2013-04-27 DIAGNOSIS — D239 Other benign neoplasm of skin, unspecified: Secondary | ICD-10-CM | POA: Diagnosis not present

## 2013-04-27 DIAGNOSIS — L819 Disorder of pigmentation, unspecified: Secondary | ICD-10-CM | POA: Diagnosis not present

## 2013-04-27 DIAGNOSIS — D1801 Hemangioma of skin and subcutaneous tissue: Secondary | ICD-10-CM | POA: Diagnosis not present

## 2013-04-27 DIAGNOSIS — L821 Other seborrheic keratosis: Secondary | ICD-10-CM | POA: Diagnosis not present

## 2013-06-17 DIAGNOSIS — E785 Hyperlipidemia, unspecified: Secondary | ICD-10-CM | POA: Diagnosis not present

## 2013-06-17 DIAGNOSIS — I10 Essential (primary) hypertension: Secondary | ICD-10-CM | POA: Diagnosis not present

## 2013-06-17 DIAGNOSIS — M810 Age-related osteoporosis without current pathological fracture: Secondary | ICD-10-CM | POA: Diagnosis not present

## 2013-06-17 DIAGNOSIS — R7301 Impaired fasting glucose: Secondary | ICD-10-CM | POA: Diagnosis not present

## 2013-06-22 DIAGNOSIS — M810 Age-related osteoporosis without current pathological fracture: Secondary | ICD-10-CM | POA: Diagnosis not present

## 2013-06-22 DIAGNOSIS — E785 Hyperlipidemia, unspecified: Secondary | ICD-10-CM | POA: Diagnosis not present

## 2013-06-22 DIAGNOSIS — M1990 Unspecified osteoarthritis, unspecified site: Secondary | ICD-10-CM | POA: Diagnosis not present

## 2013-06-22 DIAGNOSIS — Z23 Encounter for immunization: Secondary | ICD-10-CM | POA: Diagnosis not present

## 2013-06-22 DIAGNOSIS — I10 Essential (primary) hypertension: Secondary | ICD-10-CM | POA: Diagnosis not present

## 2013-06-22 DIAGNOSIS — R7301 Impaired fasting glucose: Secondary | ICD-10-CM | POA: Diagnosis not present

## 2013-07-21 IMAGING — CR DG CHEST 2V
2 series · 2 of 2 positions shown · non-contrast
Comparison: 09/24/2011

CLINICAL DATA: Malignant neoplasm of the tongue.

CHEST - 2 VIEW

[w chest pa]
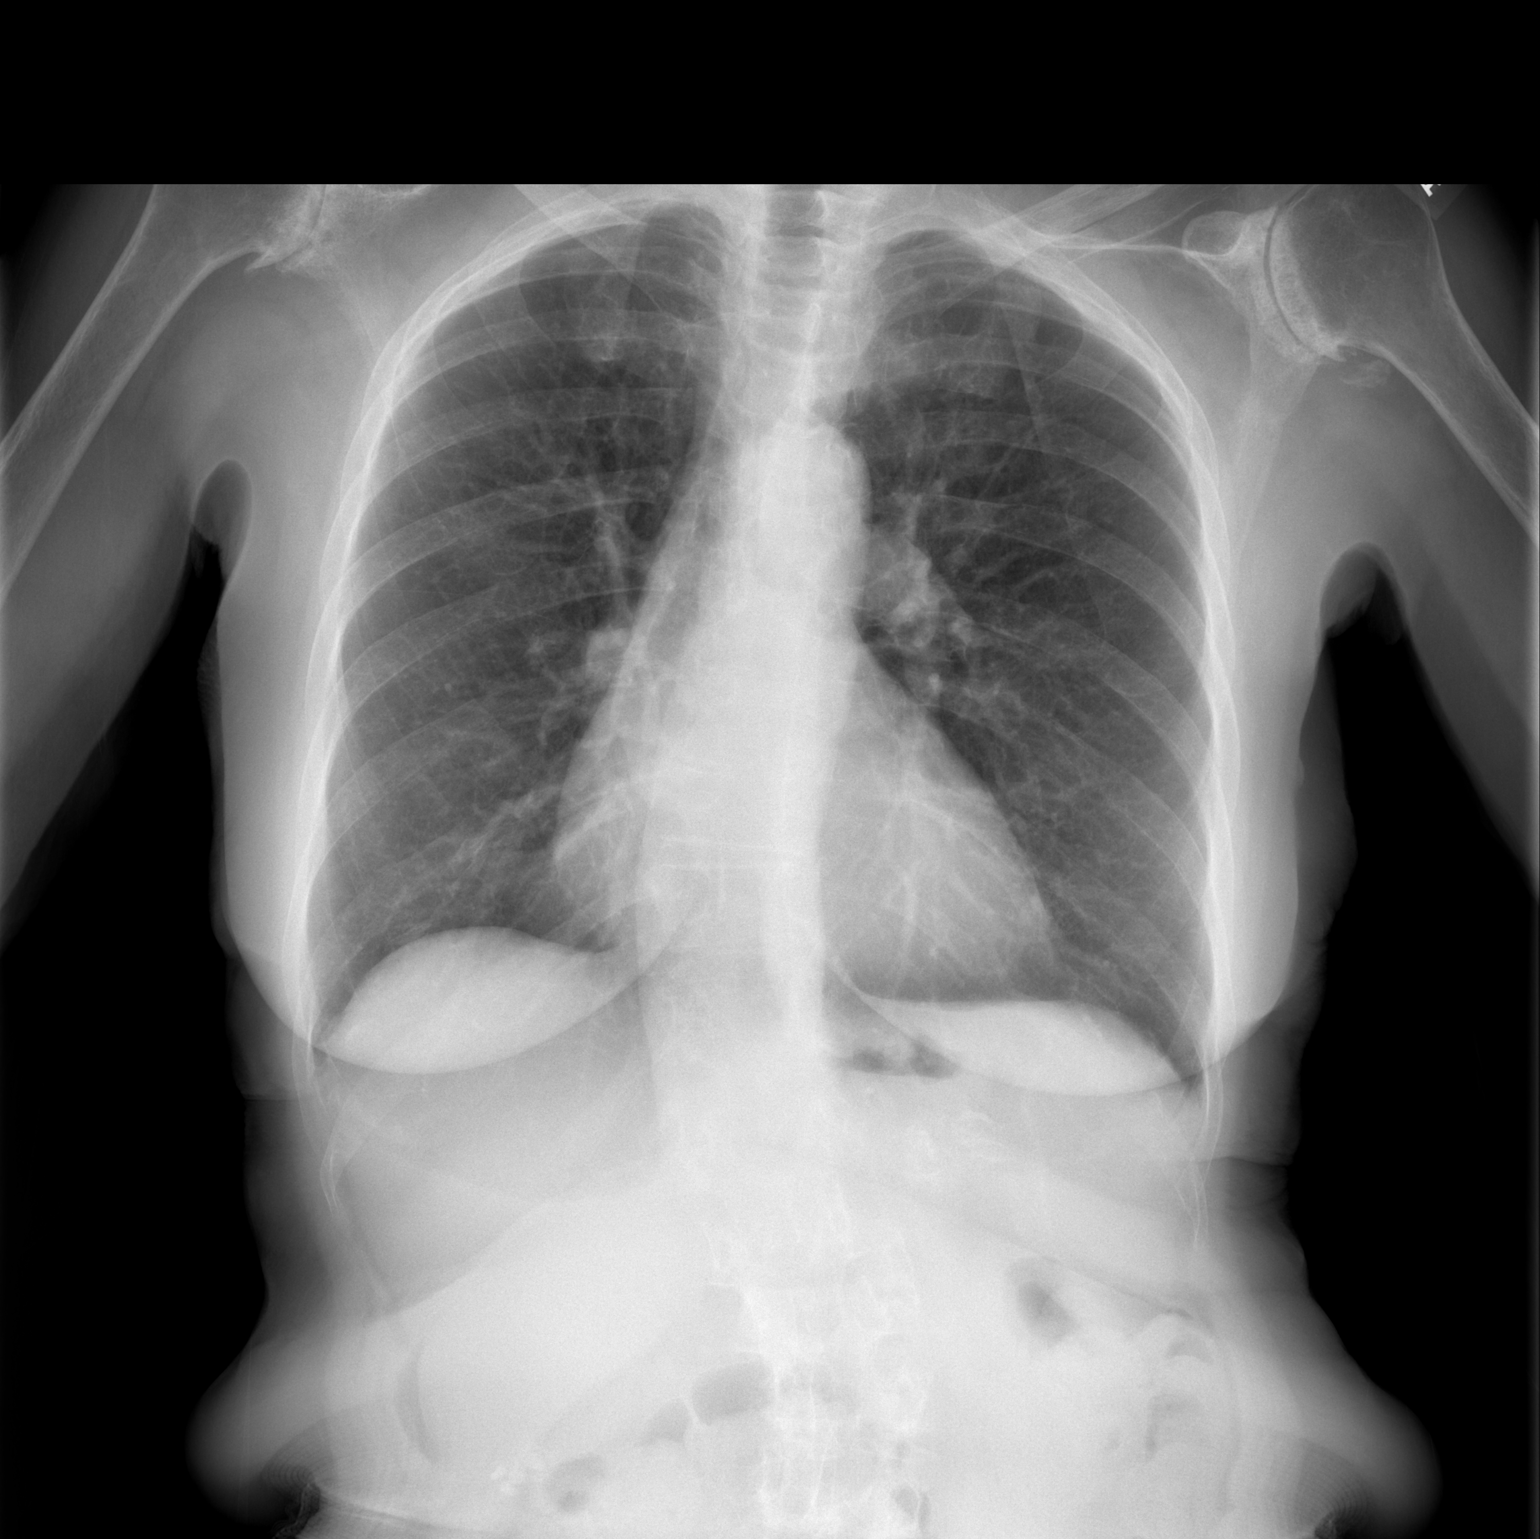

[w chest lat]
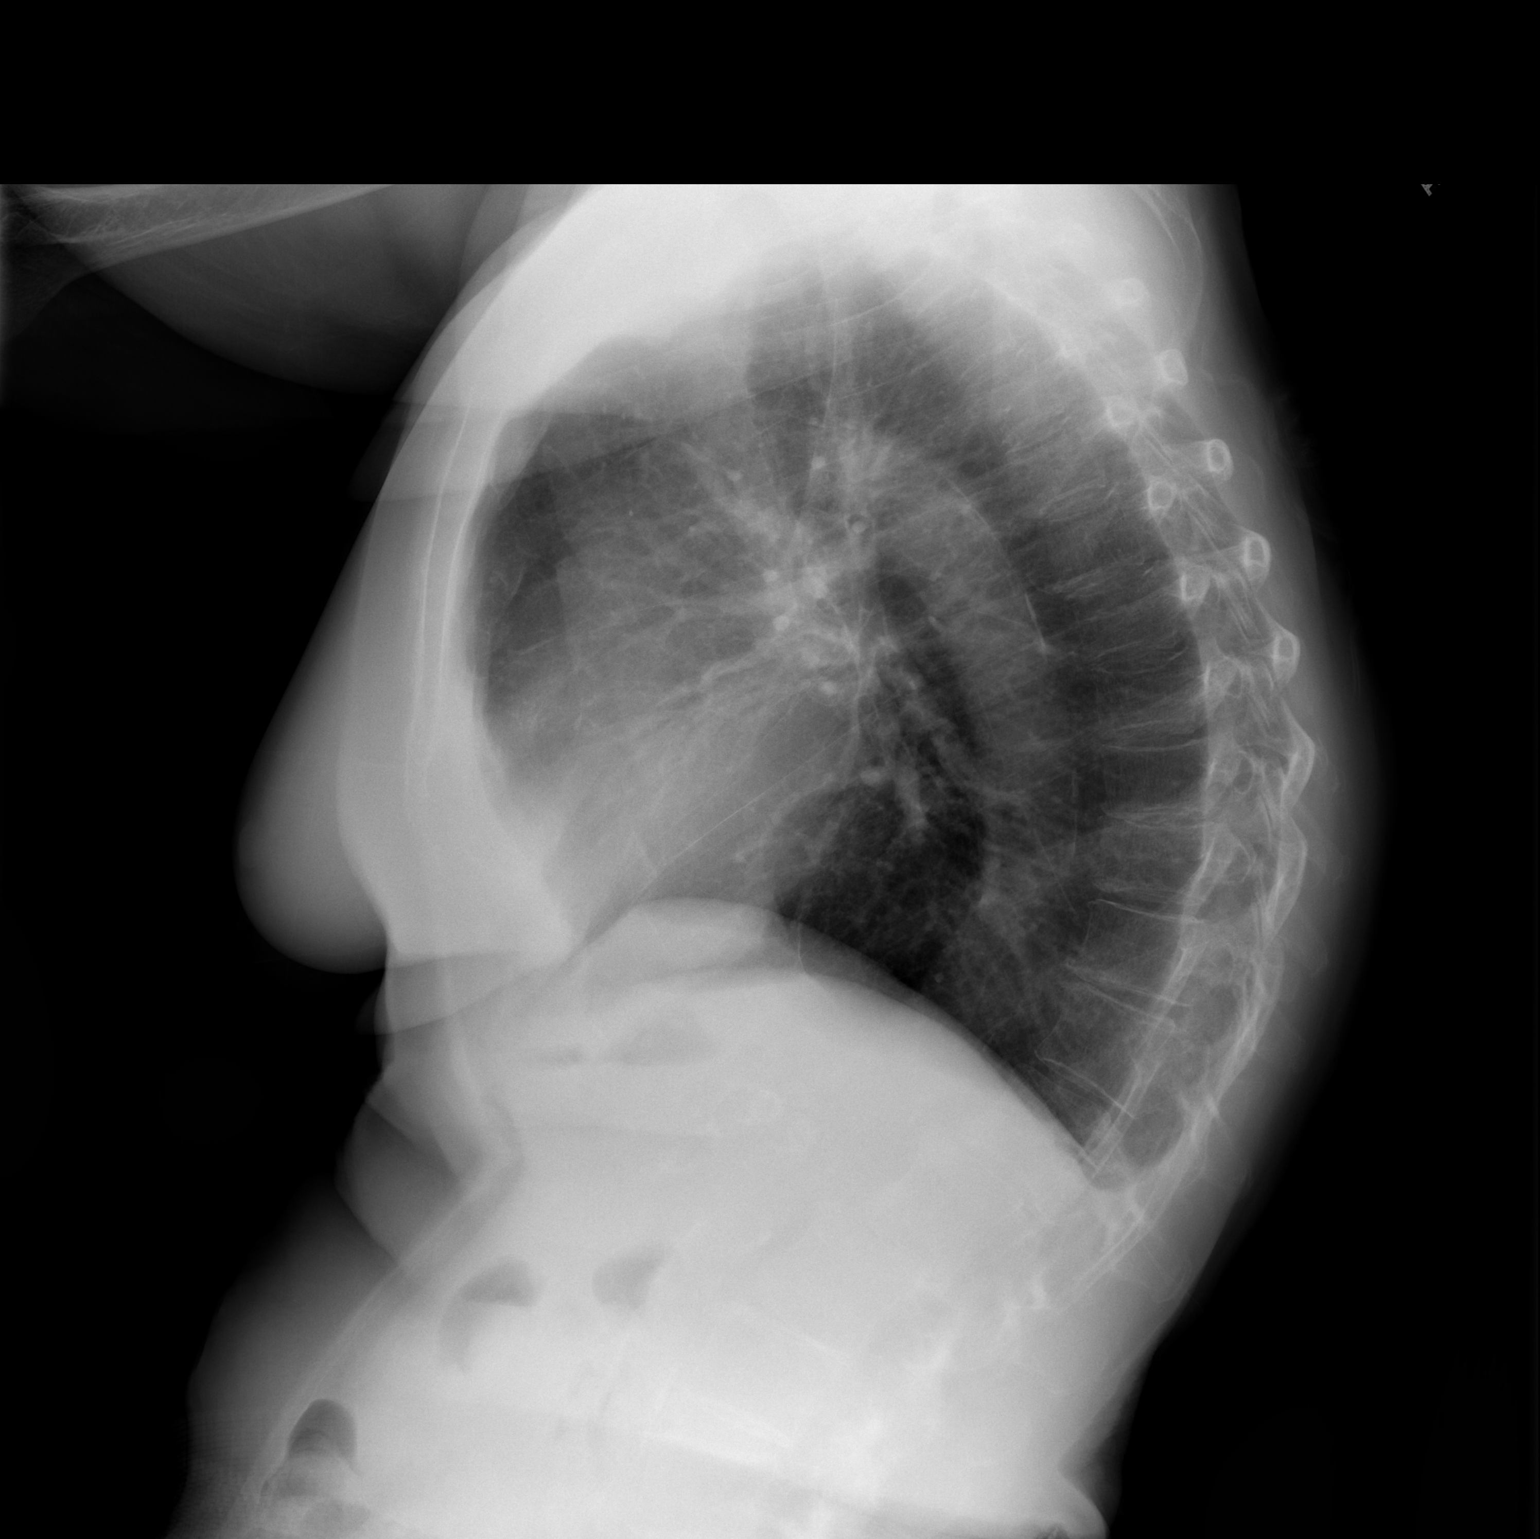

[2 of 2 positions shown; findings below may reference images not displayed]

FINDINGS: Mild hyperinflation. Lateral view degraded by patient arm
position.

Mild osteopenia with accentuation of expected thoracic kyphosis.

There is mild convex right thoracic spine curvature.  Bilateral
marked glenohumeral joint osteoarthritis.

Mild cardiomegaly. No pleural effusion or pneumothorax.  Clear
lungs.

Foci of increased density projecting over the right upper abdomen.
IMPRESSION: Mild cardiomegaly, without evidence of acute cardiopulmonary
disease or metastatic disease.

Increased density projecting over the right side of the abdomen.
Cannot exclude cholelithiasis.

## 2013-08-11 ENCOUNTER — Ambulatory Visit
Admission: RE | Admit: 2013-08-11 | Discharge: 2013-08-11 | Disposition: A | Discharge status: Home / Self Care | Payer: Medicare Other | Source: Ambulatory Visit | Attending: Otolaryngology | Admitting: Otolaryngology

## 2013-08-11 ENCOUNTER — Other Ambulatory Visit: Payer: Self-pay | Admitting: Otolaryngology

## 2013-08-11 DIAGNOSIS — Z85810 Personal history of malignant neoplasm of tongue: Secondary | ICD-10-CM | POA: Diagnosis not present

## 2013-08-11 DIAGNOSIS — Z1231 Encounter for screening mammogram for malignant neoplasm of breast: Secondary | ICD-10-CM | POA: Diagnosis not present

## 2013-08-11 DIAGNOSIS — R9389 Abnormal findings on diagnostic imaging of other specified body structures: Secondary | ICD-10-CM | POA: Diagnosis not present

## 2013-09-21 DIAGNOSIS — R7301 Impaired fasting glucose: Secondary | ICD-10-CM | POA: Diagnosis not present

## 2013-09-21 DIAGNOSIS — E785 Hyperlipidemia, unspecified: Secondary | ICD-10-CM | POA: Diagnosis not present

## 2013-09-23 DIAGNOSIS — R7301 Impaired fasting glucose: Secondary | ICD-10-CM | POA: Diagnosis not present

## 2013-09-23 DIAGNOSIS — M1990 Unspecified osteoarthritis, unspecified site: Secondary | ICD-10-CM | POA: Diagnosis not present

## 2013-09-23 DIAGNOSIS — M810 Age-related osteoporosis without current pathological fracture: Secondary | ICD-10-CM | POA: Diagnosis not present

## 2013-09-23 DIAGNOSIS — E785 Hyperlipidemia, unspecified: Secondary | ICD-10-CM | POA: Diagnosis not present

## 2013-09-23 DIAGNOSIS — I10 Essential (primary) hypertension: Secondary | ICD-10-CM | POA: Diagnosis not present

## 2013-12-22 DIAGNOSIS — E785 Hyperlipidemia, unspecified: Secondary | ICD-10-CM | POA: Diagnosis not present

## 2013-12-22 DIAGNOSIS — R7301 Impaired fasting glucose: Secondary | ICD-10-CM | POA: Diagnosis not present

## 2013-12-28 DIAGNOSIS — R7301 Impaired fasting glucose: Secondary | ICD-10-CM | POA: Diagnosis not present

## 2013-12-28 DIAGNOSIS — I10 Essential (primary) hypertension: Secondary | ICD-10-CM | POA: Diagnosis not present

## 2013-12-28 DIAGNOSIS — E785 Hyperlipidemia, unspecified: Secondary | ICD-10-CM | POA: Diagnosis not present

## 2013-12-28 DIAGNOSIS — M810 Age-related osteoporosis without current pathological fracture: Secondary | ICD-10-CM | POA: Diagnosis not present

## 2013-12-28 DIAGNOSIS — M1990 Unspecified osteoarthritis, unspecified site: Secondary | ICD-10-CM | POA: Diagnosis not present

## 2014-01-19 DIAGNOSIS — Z85810 Personal history of malignant neoplasm of tongue: Secondary | ICD-10-CM | POA: Diagnosis not present

## 2014-03-29 DIAGNOSIS — M810 Age-related osteoporosis without current pathological fracture: Secondary | ICD-10-CM | POA: Diagnosis not present

## 2014-03-29 DIAGNOSIS — E785 Hyperlipidemia, unspecified: Secondary | ICD-10-CM | POA: Diagnosis not present

## 2014-03-29 DIAGNOSIS — R7301 Impaired fasting glucose: Secondary | ICD-10-CM | POA: Diagnosis not present

## 2014-03-31 DIAGNOSIS — E785 Hyperlipidemia, unspecified: Secondary | ICD-10-CM | POA: Diagnosis not present

## 2014-03-31 DIAGNOSIS — M1990 Unspecified osteoarthritis, unspecified site: Secondary | ICD-10-CM | POA: Diagnosis not present

## 2014-03-31 DIAGNOSIS — R7301 Impaired fasting glucose: Secondary | ICD-10-CM | POA: Diagnosis not present

## 2014-03-31 DIAGNOSIS — I10 Essential (primary) hypertension: Secondary | ICD-10-CM | POA: Diagnosis not present

## 2014-03-31 DIAGNOSIS — M810 Age-related osteoporosis without current pathological fracture: Secondary | ICD-10-CM | POA: Diagnosis not present

## 2014-05-05 DIAGNOSIS — D2271 Melanocytic nevi of right lower limb, including hip: Secondary | ICD-10-CM | POA: Diagnosis not present

## 2014-05-05 DIAGNOSIS — L821 Other seborrheic keratosis: Secondary | ICD-10-CM | POA: Diagnosis not present

## 2014-05-05 DIAGNOSIS — D1801 Hemangioma of skin and subcutaneous tissue: Secondary | ICD-10-CM | POA: Diagnosis not present

## 2014-05-05 DIAGNOSIS — D239 Other benign neoplasm of skin, unspecified: Secondary | ICD-10-CM | POA: Diagnosis not present

## 2014-05-05 DIAGNOSIS — Z85828 Personal history of other malignant neoplasm of skin: Secondary | ICD-10-CM | POA: Diagnosis not present

## 2014-05-05 DIAGNOSIS — Z86018 Personal history of other benign neoplasm: Secondary | ICD-10-CM | POA: Diagnosis not present

## 2014-05-05 DIAGNOSIS — L814 Other melanin hyperpigmentation: Secondary | ICD-10-CM | POA: Diagnosis not present

## 2014-06-28 DIAGNOSIS — R7301 Impaired fasting glucose: Secondary | ICD-10-CM | POA: Diagnosis not present

## 2014-06-28 DIAGNOSIS — I10 Essential (primary) hypertension: Secondary | ICD-10-CM | POA: Diagnosis not present

## 2014-06-30 DIAGNOSIS — R7301 Impaired fasting glucose: Secondary | ICD-10-CM | POA: Diagnosis not present

## 2014-06-30 DIAGNOSIS — M1990 Unspecified osteoarthritis, unspecified site: Secondary | ICD-10-CM | POA: Diagnosis not present

## 2014-06-30 DIAGNOSIS — I10 Essential (primary) hypertension: Secondary | ICD-10-CM | POA: Diagnosis not present

## 2014-06-30 DIAGNOSIS — E782 Mixed hyperlipidemia: Secondary | ICD-10-CM | POA: Diagnosis not present

## 2014-06-30 DIAGNOSIS — M810 Age-related osteoporosis without current pathological fracture: Secondary | ICD-10-CM | POA: Diagnosis not present

## 2014-07-27 DIAGNOSIS — Z85810 Personal history of malignant neoplasm of tongue: Secondary | ICD-10-CM | POA: Diagnosis not present

## 2014-08-17 DIAGNOSIS — Z803 Family history of malignant neoplasm of breast: Secondary | ICD-10-CM | POA: Diagnosis not present

## 2014-08-17 DIAGNOSIS — Z1231 Encounter for screening mammogram for malignant neoplasm of breast: Secondary | ICD-10-CM | POA: Diagnosis not present

## 2014-09-22 DIAGNOSIS — H25019 Cortical age-related cataract, unspecified eye: Secondary | ICD-10-CM | POA: Diagnosis not present

## 2014-09-22 DIAGNOSIS — H25099 Other age-related incipient cataract, unspecified eye: Secondary | ICD-10-CM | POA: Diagnosis not present

## 2014-09-22 DIAGNOSIS — H1851 Endothelial corneal dystrophy: Secondary | ICD-10-CM | POA: Diagnosis not present

## 2014-09-22 DIAGNOSIS — H5213 Myopia, bilateral: Secondary | ICD-10-CM | POA: Diagnosis not present

## 2014-10-04 DIAGNOSIS — R7301 Impaired fasting glucose: Secondary | ICD-10-CM | POA: Diagnosis not present

## 2014-10-04 DIAGNOSIS — I10 Essential (primary) hypertension: Secondary | ICD-10-CM | POA: Diagnosis not present

## 2014-10-06 DIAGNOSIS — E782 Mixed hyperlipidemia: Secondary | ICD-10-CM | POA: Diagnosis not present

## 2014-10-06 DIAGNOSIS — M1990 Unspecified osteoarthritis, unspecified site: Secondary | ICD-10-CM | POA: Diagnosis not present

## 2014-10-06 DIAGNOSIS — I10 Essential (primary) hypertension: Secondary | ICD-10-CM | POA: Diagnosis not present

## 2014-10-06 DIAGNOSIS — R7301 Impaired fasting glucose: Secondary | ICD-10-CM | POA: Diagnosis not present

## 2014-10-06 DIAGNOSIS — M810 Age-related osteoporosis without current pathological fracture: Secondary | ICD-10-CM | POA: Diagnosis not present

## 2015-01-10 DIAGNOSIS — I10 Essential (primary) hypertension: Secondary | ICD-10-CM | POA: Diagnosis not present

## 2015-01-10 DIAGNOSIS — R7301 Impaired fasting glucose: Secondary | ICD-10-CM | POA: Diagnosis not present

## 2015-01-12 DIAGNOSIS — E782 Mixed hyperlipidemia: Secondary | ICD-10-CM | POA: Diagnosis not present

## 2015-01-12 DIAGNOSIS — I10 Essential (primary) hypertension: Secondary | ICD-10-CM | POA: Diagnosis not present

## 2015-01-12 DIAGNOSIS — M1990 Unspecified osteoarthritis, unspecified site: Secondary | ICD-10-CM | POA: Diagnosis not present

## 2015-01-12 DIAGNOSIS — R7301 Impaired fasting glucose: Secondary | ICD-10-CM | POA: Diagnosis not present

## 2015-01-12 DIAGNOSIS — M810 Age-related osteoporosis without current pathological fracture: Secondary | ICD-10-CM | POA: Diagnosis not present

## 2015-02-14 ENCOUNTER — Other Ambulatory Visit: Payer: Self-pay | Admitting: Otolaryngology

## 2015-02-14 ENCOUNTER — Ambulatory Visit
Admission: RE | Admit: 2015-02-14 | Discharge: 2015-02-14 | Disposition: A | Discharge status: Home / Self Care | Payer: Medicare Other | Source: Ambulatory Visit | Attending: Otolaryngology | Admitting: Otolaryngology

## 2015-02-14 DIAGNOSIS — Z85810 Personal history of malignant neoplasm of tongue: Secondary | ICD-10-CM | POA: Diagnosis not present

## 2015-02-14 DIAGNOSIS — M4854XA Collapsed vertebra, not elsewhere classified, thoracic region, initial encounter for fracture: Secondary | ICD-10-CM | POA: Diagnosis not present

## 2015-04-11 DIAGNOSIS — R7301 Impaired fasting glucose: Secondary | ICD-10-CM | POA: Diagnosis not present

## 2015-04-11 DIAGNOSIS — I10 Essential (primary) hypertension: Secondary | ICD-10-CM | POA: Diagnosis not present

## 2015-04-13 DIAGNOSIS — Z23 Encounter for immunization: Secondary | ICD-10-CM | POA: Diagnosis not present

## 2015-04-13 DIAGNOSIS — E782 Mixed hyperlipidemia: Secondary | ICD-10-CM | POA: Diagnosis not present

## 2015-04-13 DIAGNOSIS — I10 Essential (primary) hypertension: Secondary | ICD-10-CM | POA: Diagnosis not present

## 2015-04-13 DIAGNOSIS — M810 Age-related osteoporosis without current pathological fracture: Secondary | ICD-10-CM | POA: Diagnosis not present

## 2015-04-13 DIAGNOSIS — R7301 Impaired fasting glucose: Secondary | ICD-10-CM | POA: Diagnosis not present

## 2015-04-13 DIAGNOSIS — M1990 Unspecified osteoarthritis, unspecified site: Secondary | ICD-10-CM | POA: Diagnosis not present

## 2015-05-09 DIAGNOSIS — D1801 Hemangioma of skin and subcutaneous tissue: Secondary | ICD-10-CM | POA: Diagnosis not present

## 2015-05-09 DIAGNOSIS — L814 Other melanin hyperpigmentation: Secondary | ICD-10-CM | POA: Diagnosis not present

## 2015-05-09 DIAGNOSIS — L821 Other seborrheic keratosis: Secondary | ICD-10-CM | POA: Diagnosis not present

## 2015-05-09 DIAGNOSIS — D225 Melanocytic nevi of trunk: Secondary | ICD-10-CM | POA: Diagnosis not present

## 2015-05-09 DIAGNOSIS — Z85828 Personal history of other malignant neoplasm of skin: Secondary | ICD-10-CM | POA: Diagnosis not present

## 2015-05-09 DIAGNOSIS — D2271 Melanocytic nevi of right lower limb, including hip: Secondary | ICD-10-CM | POA: Diagnosis not present

## 2015-05-09 DIAGNOSIS — Z86018 Personal history of other benign neoplasm: Secondary | ICD-10-CM | POA: Diagnosis not present

## 2015-07-18 DIAGNOSIS — R7301 Impaired fasting glucose: Secondary | ICD-10-CM | POA: Diagnosis not present

## 2015-07-18 DIAGNOSIS — I10 Essential (primary) hypertension: Secondary | ICD-10-CM | POA: Diagnosis not present

## 2015-07-20 DIAGNOSIS — Z23 Encounter for immunization: Secondary | ICD-10-CM | POA: Diagnosis not present

## 2015-07-20 DIAGNOSIS — R7301 Impaired fasting glucose: Secondary | ICD-10-CM | POA: Diagnosis not present

## 2015-07-20 DIAGNOSIS — M1990 Unspecified osteoarthritis, unspecified site: Secondary | ICD-10-CM | POA: Diagnosis not present

## 2015-07-20 DIAGNOSIS — M810 Age-related osteoporosis without current pathological fracture: Secondary | ICD-10-CM | POA: Diagnosis not present

## 2015-07-20 DIAGNOSIS — I10 Essential (primary) hypertension: Secondary | ICD-10-CM | POA: Diagnosis not present

## 2015-07-20 DIAGNOSIS — E782 Mixed hyperlipidemia: Secondary | ICD-10-CM | POA: Diagnosis not present

## 2015-08-22 DIAGNOSIS — Z85810 Personal history of malignant neoplasm of tongue: Secondary | ICD-10-CM | POA: Diagnosis not present

## 2015-08-23 DIAGNOSIS — Z1231 Encounter for screening mammogram for malignant neoplasm of breast: Secondary | ICD-10-CM | POA: Diagnosis not present

## 2015-08-23 DIAGNOSIS — Z803 Family history of malignant neoplasm of breast: Secondary | ICD-10-CM | POA: Diagnosis not present

## 2015-10-24 DIAGNOSIS — M810 Age-related osteoporosis without current pathological fracture: Secondary | ICD-10-CM | POA: Diagnosis not present

## 2015-10-24 DIAGNOSIS — I10 Essential (primary) hypertension: Secondary | ICD-10-CM | POA: Diagnosis not present

## 2015-10-24 DIAGNOSIS — R7301 Impaired fasting glucose: Secondary | ICD-10-CM | POA: Diagnosis not present

## 2015-10-26 DIAGNOSIS — R7301 Impaired fasting glucose: Secondary | ICD-10-CM | POA: Diagnosis not present

## 2015-10-26 DIAGNOSIS — I10 Essential (primary) hypertension: Secondary | ICD-10-CM | POA: Diagnosis not present

## 2015-10-26 DIAGNOSIS — E782 Mixed hyperlipidemia: Secondary | ICD-10-CM | POA: Diagnosis not present

## 2015-10-26 DIAGNOSIS — M1990 Unspecified osteoarthritis, unspecified site: Secondary | ICD-10-CM | POA: Diagnosis not present

## 2015-10-26 DIAGNOSIS — M810 Age-related osteoporosis without current pathological fracture: Secondary | ICD-10-CM | POA: Diagnosis not present

## 2016-01-23 DIAGNOSIS — I10 Essential (primary) hypertension: Secondary | ICD-10-CM | POA: Diagnosis not present

## 2016-01-23 DIAGNOSIS — R7301 Impaired fasting glucose: Secondary | ICD-10-CM | POA: Diagnosis not present

## 2016-01-25 DIAGNOSIS — E782 Mixed hyperlipidemia: Secondary | ICD-10-CM | POA: Diagnosis not present

## 2016-01-25 DIAGNOSIS — I10 Essential (primary) hypertension: Secondary | ICD-10-CM | POA: Diagnosis not present

## 2016-01-25 DIAGNOSIS — M810 Age-related osteoporosis without current pathological fracture: Secondary | ICD-10-CM | POA: Diagnosis not present

## 2016-01-25 DIAGNOSIS — M1990 Unspecified osteoarthritis, unspecified site: Secondary | ICD-10-CM | POA: Diagnosis not present

## 2016-01-25 DIAGNOSIS — R7301 Impaired fasting glucose: Secondary | ICD-10-CM | POA: Diagnosis not present

## 2016-04-23 DIAGNOSIS — I10 Essential (primary) hypertension: Secondary | ICD-10-CM | POA: Diagnosis not present

## 2016-04-23 DIAGNOSIS — R7301 Impaired fasting glucose: Secondary | ICD-10-CM | POA: Diagnosis not present

## 2016-04-25 DIAGNOSIS — I10 Essential (primary) hypertension: Secondary | ICD-10-CM | POA: Diagnosis not present

## 2016-04-25 DIAGNOSIS — Z23 Encounter for immunization: Secondary | ICD-10-CM | POA: Diagnosis not present

## 2016-04-25 DIAGNOSIS — M810 Age-related osteoporosis without current pathological fracture: Secondary | ICD-10-CM | POA: Diagnosis not present

## 2016-04-25 DIAGNOSIS — E782 Mixed hyperlipidemia: Secondary | ICD-10-CM | POA: Diagnosis not present

## 2016-04-25 DIAGNOSIS — R7301 Impaired fasting glucose: Secondary | ICD-10-CM | POA: Diagnosis not present

## 2016-04-25 DIAGNOSIS — M1990 Unspecified osteoarthritis, unspecified site: Secondary | ICD-10-CM | POA: Diagnosis not present

## 2016-05-22 DIAGNOSIS — Z85810 Personal history of malignant neoplasm of tongue: Secondary | ICD-10-CM | POA: Diagnosis not present

## 2016-07-23 DIAGNOSIS — I10 Essential (primary) hypertension: Secondary | ICD-10-CM | POA: Diagnosis not present

## 2016-07-23 DIAGNOSIS — R7301 Impaired fasting glucose: Secondary | ICD-10-CM | POA: Diagnosis not present

## 2016-07-25 DIAGNOSIS — R7301 Impaired fasting glucose: Secondary | ICD-10-CM | POA: Diagnosis not present

## 2016-07-25 DIAGNOSIS — M150 Primary generalized (osteo)arthritis: Secondary | ICD-10-CM | POA: Diagnosis not present

## 2016-07-25 DIAGNOSIS — M810 Age-related osteoporosis without current pathological fracture: Secondary | ICD-10-CM | POA: Diagnosis not present

## 2016-07-25 DIAGNOSIS — E782 Mixed hyperlipidemia: Secondary | ICD-10-CM | POA: Diagnosis not present

## 2016-07-25 DIAGNOSIS — I10 Essential (primary) hypertension: Secondary | ICD-10-CM | POA: Diagnosis not present

## 2016-07-28 DIAGNOSIS — I10 Essential (primary) hypertension: Secondary | ICD-10-CM | POA: Insufficient documentation

## 2016-07-28 DIAGNOSIS — R7301 Impaired fasting glucose: Secondary | ICD-10-CM | POA: Insufficient documentation

## 2016-07-28 DIAGNOSIS — M159 Polyosteoarthritis, unspecified: Secondary | ICD-10-CM | POA: Insufficient documentation

## 2016-07-28 DIAGNOSIS — E782 Mixed hyperlipidemia: Secondary | ICD-10-CM | POA: Insufficient documentation

## 2016-07-28 DIAGNOSIS — M810 Age-related osteoporosis without current pathological fracture: Secondary | ICD-10-CM | POA: Insufficient documentation

## 2016-09-04 DIAGNOSIS — Z803 Family history of malignant neoplasm of breast: Secondary | ICD-10-CM | POA: Diagnosis not present

## 2016-09-04 DIAGNOSIS — Z1231 Encounter for screening mammogram for malignant neoplasm of breast: Secondary | ICD-10-CM | POA: Diagnosis not present

## 2016-10-17 DIAGNOSIS — I10 Essential (primary) hypertension: Secondary | ICD-10-CM | POA: Diagnosis not present

## 2016-10-17 DIAGNOSIS — R7301 Impaired fasting glucose: Secondary | ICD-10-CM | POA: Diagnosis not present

## 2016-10-22 DIAGNOSIS — M810 Age-related osteoporosis without current pathological fracture: Secondary | ICD-10-CM | POA: Diagnosis not present

## 2016-10-22 DIAGNOSIS — E782 Mixed hyperlipidemia: Secondary | ICD-10-CM | POA: Diagnosis not present

## 2016-10-22 DIAGNOSIS — R7301 Impaired fasting glucose: Secondary | ICD-10-CM | POA: Diagnosis not present

## 2016-10-22 DIAGNOSIS — M150 Primary generalized (osteo)arthritis: Secondary | ICD-10-CM | POA: Diagnosis not present

## 2016-10-22 DIAGNOSIS — I10 Essential (primary) hypertension: Secondary | ICD-10-CM | POA: Diagnosis not present

## 2016-10-31 DIAGNOSIS — L821 Other seborrheic keratosis: Secondary | ICD-10-CM | POA: Diagnosis not present

## 2016-10-31 DIAGNOSIS — Z85828 Personal history of other malignant neoplasm of skin: Secondary | ICD-10-CM | POA: Diagnosis not present

## 2016-10-31 DIAGNOSIS — Z86018 Personal history of other benign neoplasm: Secondary | ICD-10-CM | POA: Diagnosis not present

## 2016-10-31 DIAGNOSIS — L400 Psoriasis vulgaris: Secondary | ICD-10-CM | POA: Diagnosis not present

## 2016-10-31 DIAGNOSIS — D1801 Hemangioma of skin and subcutaneous tissue: Secondary | ICD-10-CM | POA: Diagnosis not present

## 2016-10-31 DIAGNOSIS — D2271 Melanocytic nevi of right lower limb, including hip: Secondary | ICD-10-CM | POA: Diagnosis not present

## 2016-10-31 DIAGNOSIS — L814 Other melanin hyperpigmentation: Secondary | ICD-10-CM | POA: Diagnosis not present

## 2016-10-31 DIAGNOSIS — D225 Melanocytic nevi of trunk: Secondary | ICD-10-CM | POA: Diagnosis not present

## 2017-01-17 DIAGNOSIS — I10 Essential (primary) hypertension: Secondary | ICD-10-CM | POA: Diagnosis not present

## 2017-01-17 DIAGNOSIS — R7301 Impaired fasting glucose: Secondary | ICD-10-CM | POA: Diagnosis not present

## 2017-01-17 DIAGNOSIS — M810 Age-related osteoporosis without current pathological fracture: Secondary | ICD-10-CM | POA: Diagnosis not present

## 2017-01-23 DIAGNOSIS — I10 Essential (primary) hypertension: Secondary | ICD-10-CM | POA: Diagnosis not present

## 2017-01-23 DIAGNOSIS — E782 Mixed hyperlipidemia: Secondary | ICD-10-CM | POA: Diagnosis not present

## 2017-01-23 DIAGNOSIS — M150 Primary generalized (osteo)arthritis: Secondary | ICD-10-CM | POA: Diagnosis not present

## 2017-01-23 DIAGNOSIS — M810 Age-related osteoporosis without current pathological fracture: Secondary | ICD-10-CM | POA: Diagnosis not present

## 2017-01-23 DIAGNOSIS — R7301 Impaired fasting glucose: Secondary | ICD-10-CM | POA: Diagnosis not present

## 2017-02-04 ENCOUNTER — Ambulatory Visit
Admission: RE | Admit: 2017-02-04 | Discharge: 2017-02-04 | Disposition: A | Discharge status: Home / Self Care | Payer: Medicare Other | Source: Ambulatory Visit | Attending: Otolaryngology | Admitting: Otolaryngology

## 2017-02-04 ENCOUNTER — Other Ambulatory Visit: Payer: Self-pay | Admitting: Otolaryngology

## 2017-02-04 DIAGNOSIS — C029 Malignant neoplasm of tongue, unspecified: Secondary | ICD-10-CM

## 2017-02-04 DIAGNOSIS — Z85810 Personal history of malignant neoplasm of tongue: Secondary | ICD-10-CM | POA: Diagnosis not present

## 2017-02-04 DIAGNOSIS — I517 Cardiomegaly: Secondary | ICD-10-CM | POA: Diagnosis not present

## 2017-03-15 ENCOUNTER — Encounter (HOSPITAL_COMMUNITY): Payer: Self-pay | Admitting: Nurse Practitioner

## 2017-03-15 ENCOUNTER — Observation Stay (HOSPITAL_COMMUNITY): Payer: Medicare Other

## 2017-03-15 ENCOUNTER — Inpatient Hospital Stay (HOSPITAL_COMMUNITY)
Admission: EM | Admit: 2017-03-15 | Discharge: 2017-03-17 | DRG: 603 | Disposition: A | Discharge status: Home / Self Care | Payer: Medicare Other | Attending: Internal Medicine | Admitting: Internal Medicine

## 2017-03-15 DIAGNOSIS — L97219 Non-pressure chronic ulcer of right calf with unspecified severity: Secondary | ICD-10-CM | POA: Diagnosis present

## 2017-03-15 DIAGNOSIS — R509 Fever, unspecified: Secondary | ICD-10-CM | POA: Diagnosis not present

## 2017-03-15 DIAGNOSIS — Z79899 Other long term (current) drug therapy: Secondary | ICD-10-CM

## 2017-03-15 DIAGNOSIS — R000 Tachycardia, unspecified: Secondary | ICD-10-CM | POA: Diagnosis not present

## 2017-03-15 DIAGNOSIS — R6510 Systemic inflammatory response syndrome (SIRS) of non-infectious origin without acute organ dysfunction: Secondary | ICD-10-CM | POA: Diagnosis not present

## 2017-03-15 DIAGNOSIS — R739 Hyperglycemia, unspecified: Secondary | ICD-10-CM | POA: Diagnosis not present

## 2017-03-15 DIAGNOSIS — Z833 Family history of diabetes mellitus: Secondary | ICD-10-CM

## 2017-03-15 DIAGNOSIS — I10 Essential (primary) hypertension: Secondary | ICD-10-CM | POA: Diagnosis present

## 2017-03-15 DIAGNOSIS — D72829 Elevated white blood cell count, unspecified: Secondary | ICD-10-CM

## 2017-03-15 DIAGNOSIS — Z806 Family history of leukemia: Secondary | ICD-10-CM

## 2017-03-15 DIAGNOSIS — M810 Age-related osteoporosis without current pathological fracture: Secondary | ICD-10-CM | POA: Diagnosis present

## 2017-03-15 DIAGNOSIS — Z8249 Family history of ischemic heart disease and other diseases of the circulatory system: Secondary | ICD-10-CM

## 2017-03-15 DIAGNOSIS — L0390 Cellulitis, unspecified: Secondary | ICD-10-CM | POA: Diagnosis present

## 2017-03-15 DIAGNOSIS — L03115 Cellulitis of right lower limb: Secondary | ICD-10-CM | POA: Diagnosis not present

## 2017-03-15 DIAGNOSIS — Z85810 Personal history of malignant neoplasm of tongue: Secondary | ICD-10-CM

## 2017-03-15 DIAGNOSIS — I248 Other forms of acute ischemic heart disease: Secondary | ICD-10-CM | POA: Diagnosis not present

## 2017-03-15 DIAGNOSIS — Z87891 Personal history of nicotine dependence: Secondary | ICD-10-CM

## 2017-03-15 DIAGNOSIS — Z885 Allergy status to narcotic agent status: Secondary | ICD-10-CM

## 2017-03-15 LAB — CBC WITH DIFFERENTIAL/PLATELET
BASOS PCT: 0 %
Basophils Absolute: 0 10*3/uL (ref 0.0–0.1)
Eosinophils Absolute: 0.1 10*3/uL (ref 0.0–0.7)
Eosinophils Relative: 1 %
HEMATOCRIT: 38.4 % (ref 36.0–46.0)
HEMOGLOBIN: 12.3 g/dL (ref 12.0–15.0)
LYMPHS ABS: 1.1 10*3/uL (ref 0.7–4.0)
Lymphocytes Relative: 7 %
MCH: 28.7 pg (ref 26.0–34.0)
MCHC: 32 g/dL (ref 30.0–36.0)
MCV: 89.5 fL (ref 78.0–100.0)
MONOS PCT: 8 %
Monocytes Absolute: 1.3 10*3/uL — ABNORMAL HIGH (ref 0.1–1.0)
NEUTROS PCT: 84 %
Neutro Abs: 13.2 10*3/uL — ABNORMAL HIGH (ref 1.7–7.7)
Platelets: 233 10*3/uL (ref 150–400)
RBC: 4.29 MIL/uL (ref 3.87–5.11)
RDW: 14.2 % (ref 11.5–15.5)
WBC: 15.8 10*3/uL — ABNORMAL HIGH (ref 4.0–10.5)

## 2017-03-15 LAB — COMPREHENSIVE METABOLIC PANEL
ALT: 25 U/L (ref 14–54)
AST: 33 U/L (ref 15–41)
Albumin: 3.7 g/dL (ref 3.5–5.0)
Alkaline Phosphatase: 64 U/L (ref 38–126)
Anion gap: 7 (ref 5–15)
BILIRUBIN TOTAL: 0.6 mg/dL (ref 0.3–1.2)
BUN: 27 mg/dL — AB (ref 6–20)
CALCIUM: 9.1 mg/dL (ref 8.9–10.3)
CO2: 24 mmol/L (ref 22–32)
CREATININE: 0.79 mg/dL (ref 0.44–1.00)
Chloride: 109 mmol/L (ref 101–111)
GFR calc Af Amer: >60 mL/min (ref >60)
GFR calc non Af Amer: >60 mL/min (ref >60)
Glucose, Bld: 106 mg/dL — ABNORMAL HIGH (ref 65–99)
Potassium: 3.9 mmol/L (ref 3.5–5.1)
Sodium: 140 mmol/L (ref 135–145)
TOTAL PROTEIN: 6.4 g/dL — AB (ref 6.5–8.1)

## 2017-03-15 LAB — URINALYSIS, ROUTINE W REFLEX MICROSCOPIC
BILIRUBIN URINE: NEGATIVE
Bacteria, UA: NONE SEEN
GLUCOSE, UA: NEGATIVE mg/dL
Ketones, ur: 5 mg/dL — AB
LEUKOCYTES UA: NEGATIVE
NITRITE: NEGATIVE
PROTEIN: NEGATIVE mg/dL
Specific Gravity, Urine: 1.013 (ref 1.005–1.030)
pH: 6 (ref 5.0–8.0)

## 2017-03-15 LAB — PROTIME-INR
INR: 0.91
Prothrombin Time: 12.2 seconds (ref 11.4–15.2)

## 2017-03-15 LAB — I-STAT CG4 LACTIC ACID, ED: LACTIC ACID, VENOUS: 0.95 mmol/L (ref 0.5–1.9)

## 2017-03-15 MED ORDER — ACETAMINOPHEN 325 MG PO TABS
650.0000 mg | ORAL_TABLET | Freq: Once | ORAL | Status: AC
  Administered 2017-03-15: 650 mg via ORAL
  Filled 2017-03-15: qty 2

## 2017-03-15 MED ORDER — SODIUM CHLORIDE 0.9 % IV BOLUS (SEPSIS)
500.0000 mL | Freq: Once | INTRAVENOUS | Status: AC
  Administered 2017-03-15: 500 mL via INTRAVENOUS

## 2017-03-15 MED ORDER — VANCOMYCIN HCL IN DEXTROSE 1-5 GM/200ML-% IV SOLN
1000.0000 mg | Freq: Once | INTRAVENOUS | Status: AC
  Administered 2017-03-15: 1000 mg via INTRAVENOUS
  Filled 2017-03-15: qty 200

## 2017-03-15 NOTE — Progress Notes (Signed)
Please call report to [place] at [phone]

## 2017-03-15 NOTE — Progress Notes (Signed)
A consult was received from an ED physician for Vancomycin per pharmacy dosing.  The patient's profile has been reviewed for ht/wt/allergies/indication/available labs.   A one time order has been placed for vancomycin 1gm iv x1.  Further antibiotics/pharmacy consults should be ordered by admitting physician if indicated.                       Thank you,

## 2017-03-15 NOTE — H&P (Addendum)
TRH H&P   Patient Demographics:    Stephanie [name], is a 83 y.o. female  MRN: [id number]   DOB - Aug 23, 1925  Admit Date - 03/15/2017  Outpatient Primary MD for the patient is Altheimer, Legrand Como, MD  Referring MD/NP/PA: Davonna Belling  Outpatient Specialists:   Patient coming from: home  Chief Complaint  Patient presents with  . Cellulitis  . Rash      HPI:    Stephanie [name]  is a 83 y.o. female, w hypertension, osteoporosis, hx of squamous cell carcinoma tongue, apparently presents with c/o small ulcer behind the right calf x 2-3 weeks and now redness of the right distal lower ext as well as fever.   In ED,  Wbc 15.8, T 101.8, pulse 113, blood culture x2 pending, Bun/creat 27/0.79   Review of systems:    In addition to the HPI above,   No Headache, No changes with Vision or hearing, No problems swallowing food or Liquids, No Chest pain, Cough or Shortness of Breath, No Abdominal pain, No Nausea or Vommitting, Bowel movements are regular, No Blood in stool or Urine, No dysuria,  No new joints pains-aches,  No new weakness, tingling, numbness in any extremity, No recent weight gain or loss, No polyuria, polydypsia or polyphagia, No significant Mental Stressors.  A full 10 point Review of Systems was done, except as stated above, all other Review of Systems were negative.   With Past History of the following :    Past Medical History:  Diagnosis Date  . Arthritis   . Blood transfusion    Last hip surgery.  . Cancer ([place]) 09/11/11   tongue-squamous cell  . Hypertension   . Tongue cancer ([place]) 09/11/11   squamous cell carcinoma      Past Surgical History:  Procedure Laterality Date  . APPENDECTOMY     w/BTL  . CATARACT EXTRACTION    . HEMIGLOSSECTOMY  09/26/2011   Procedure: HEMIGLOSSECTOMY;  Surgeon: Melissa Montane, MD;  Location: [place];  Service: ENT;   Laterality: Left;  . HEMIGLOSSECTOMY  09/26/11   Left, resection-inv squamous cell carcinoma  . TOTAL HIP ARTHROPLASTY     bialt  . TUBAL LIGATION    . WISDOM TOOTH EXTRACTION        Social History:     Social History  Substance Use Topics  . Smoking status: Former Smoker    Years: 5.00    Quit date: 07/15/1956  . Smokeless tobacco: Never Used     Comment: does not remember PPD, maybe 5 cigs/day  . Alcohol use No     Lives -  At home  Mobility -  Walks by self   Family History :     Family History  Problem Relation Age of Onset  . Heart attack Mother   . Cancer Father        leukemia  .  Cancer Brother        prostate, brain tumor,lung  . Cancer Daughter        NHL  . Diabetes Maternal Uncle   . Diabetes Maternal Grandfather   . Anesthesia problems Neg Hx       Home Medications:   Prior to Admission medications   Medication Sig Start Date End Date Taking? Authorizing Provider  acetaminophen (TYLENOL) 325 MG tablet Take 650 mg by mouth every 6 (six) hours as needed for mild pain or moderate pain.    Yes [provider]  aliskiren (TEKTURNA) 300 MG tablet Take 300 mg by mouth daily.   Yes [provider]  amLODipine (NORVASC) 5 MG tablet Take 5 mg by mouth daily.   Yes [provider]  carvedilol (COREG) 6.25 MG tablet Take 12.5 mg by mouth 2 (two) times daily at 10 am and 4 pm.   Yes [provider]  HYDROcodone-acetaminophen (NORCO) 5-325 MG per tablet Take 1 tablet by mouth every 4 (four) hours as needed for moderate pain or severe pain.    Yes [provider]  potassium chloride SA (K-DUR,KLOR-CON) 20 MEQ tablet Take 20 mEq by mouth daily.   Yes [provider]  raloxifene (EVISTA) 60 MG tablet Take 60 mg by mouth daily.   Yes [provider]  ramipril (ALTACE) 10 MG capsule Take 10 mg by mouth daily.   Yes [provider]     Allergies:     Allergies  Allergen Reactions  . Morphine  And Related     Pt states she has never taken but will not take due to husband's severe reaction to Morphine.     Physical Exam:   Vitals  Blood pressure (!) 168/55, pulse (!) 106, temperature (S) (!) 101.8 F (38.8 C), temperature source (S) Rectal, resp. rate 18, height '4\' 11"'  (1.499 m), weight 52.6 kg (116 lb), SpO2 96 %.   1. General  lying in bed in NAD,    2. Normal affect and insight, Not Suicidal or Homicidal, Awake Alert, Oriented X 3.  3. No F.N deficits, ALL C.Nerves Intact, Strength 5/5 all 4 extremities, Sensation intact all 4 extremities, Plantars down going.  4. Ears and Eyes appear Normal, Conjunctivae clear, PERRLA. Moist Oral Mucosa.  5. Supple Neck, No JVD, No cervical lymphadenopathy appriciated, + right Carotid Bruits.  6. Symmetrical Chest wall movement, Good air movement bilaterally, CTAB.  7. tachy, s1, s2, 2/6 sem rusb  8. Positive Bowel Sounds, Abdomen Soft, No tenderness, No organomegaly appriciated,No rebound -guarding or rigidity.  9.  No Cyanosis, Normal Skin Turgor, No Skin Rash or Bruise.  10. Good muscle tone,  joints appear normal , no effusions, Normal ROM.  11. No Palpable Lymph Nodes in Neck or Axillae     Data Review:    CBC  Recent Labs Lab 03/15/17 2058  WBC 15.8*  HGB 12.3  HCT 38.4  PLT 233  MCV 89.5  MCH 28.7  MCHC 32.0  RDW 14.2  LYMPHSABS 1.1  MONOABS 1.3*  EOSABS 0.1  BASOSABS 0.0   ------------------------------------------------------------------------------------------------------------------  Chemistries   Recent Labs Lab 03/15/17 2058  NA 140  K 3.9  CL 109  CO2 24  GLUCOSE 106*  BUN 27*  CREATININE 0.79  CALCIUM 9.1  AST 33  ALT 25  ALKPHOS 64  BILITOT 0.6   ------------------------------------------------------------------------------------------------------------------ estimated creatinine clearance is 35.4 mL/min (by C-G formula based on SCr of 0.79  mg/dL). ------------------------------------------------------------------------------------------------------------------ No results  for input(s): TSH, T4TOTAL, T3FREE, THYROIDAB in the last 72 hours.  Invalid input(s): FREET3  Coagulation profile  Recent Labs Lab 03/15/17 2058  INR 0.91   ------------------------------------------------------------------------------------------------------------------- No results for input(s): DDIMER in the last 72 hours. -------------------------------------------------------------------------------------------------------------------  Cardiac Enzymes No results for input(s): CKMB, TROPONINI, MYOGLOBIN in the last 168 hours.  Invalid input(s): CK ------------------------------------------------------------------------------------------------------------------ No results found for: BNP   ---------------------------------------------------------------------------------------------------------------  Urinalysis    Component Value Date/Time   COLORURINE YELLOW 03/15/2017 2058   APPEARANCEUR CLEAR 03/15/2017 2058   LABSPEC 1.013 03/15/2017 2058   PHURINE 6.0 03/15/2017 2058   GLUCOSEU NEGATIVE 03/15/2017 2058   HGBUR SMALL (A) 03/15/2017 2058   BILIRUBINUR NEGATIVE 03/15/2017 2058   KETONESUR 5 (A) 03/15/2017 2058   PROTEINUR NEGATIVE 03/15/2017 2058   UROBILINOGEN 0.2 12/31/2006 1040   NITRITE NEGATIVE 03/15/2017 2058   LEUKOCYTESUR NEGATIVE 03/15/2017 2058    ----------------------------------------------------------------------------------------------------------------   Imaging Results:    No results found.    Assessment & Plan:    Principal Problem:   Cellulitis Active Problems:   HTN (hypertension)   Fever   Leukocytosis   Hyperglycemia    Fever secondary to cellulitis Blood culture x2,  Check ESR Vanco iv pharmacy to dose Zosyn iv pharmacy to dose  Leukocytosis secondary to cellulitis  Hypertension Continue  current medications Hydralazine 7m iv q6h prn   Hyperglycemia Check hga1c  Tachycardiac Trop I q6h x3 Check echo  R carotid bruit Carotid ultrasound  Cardiac murmer echo   DVT Prophylaxis Lovenox - SCDs   AM Labs Ordered, also please review Full Orders  Family Communication: Admission, patients condition and plan of care including tests being ordered have been discussed with the patient  who indicate understanding and agree with the plan and Code Status.  Code Status FULL CODE  Likely DC to  home  Condition GUARDED    Consults called: none  Admission status: inpatient   Time spent in minutes : 45   JJani GravelM.D on 03/15/2017 at 11:09 PM  Between 7am to 7pm - Pager - [phone] 7pm go to [email or web address] - password [email or web address] - Office  [phone]

## 2017-03-15 NOTE — ED Triage Notes (Signed)
Pt is presents with right leg swelling, redness/inflamation, warmness localized posterior tibial aspect but spreading anteriorly. She states she doesn't remember exactly when she experienced a scratch on staircase step. She has been treating the scratch with peroxide and salt water.

## 2017-03-15 NOTE — ED Provider Notes (Signed)
[facility] DEPT Provider Note   CSN: [id number] Arrival date & time: 03/15/17  2040     History   Chief Complaint Chief Complaint  Patient presents with  . Cellulitis  . Rash    HPI Stephanie [name] is a 83 y.o. female.  HPI Patient presents with likely infection of her right lower leg. A couple weeks ago she scraped the back of her leg. Since then she is putting salt water and sometimes peroxide with it. Patient's son states that she just sealed in the bacteria. Doubt began to have redness and swelling in the leg. Was visiting a patient at the hospital and states it was so cold that she was shivering. Unsure if this was chills from the infection or just a cold room. No dysuria. Patient son thinks that she may have blood poisoning and developed gangrene. Has had some clear drainage to the area. No chest pain or difficulty breathing. Past Medical History:  Diagnosis Date  . Arthritis   . Blood transfusion    Last hip surgery.  . Cancer ([place]) 09/11/11   tongue-squamous cell  . Hypertension   . 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 Cellulitis 03/15/2017  . Fever 03/15/2017  . Leukocytosis 03/15/2017  . Hyperglycemia 03/15/2017  . Malignant neoplasm of anterior two-thirds of tongue, part unspecified ([place]) 09/29/2011  . Tongue cancer ([place])   . Arthritis   . Cancer ([place]) 09/11/2011  . HTN (hypertension) 08/16/2011    Past Surgical History:  Procedure Laterality Date  . APPENDECTOMY     w/BTL  . CATARACT EXTRACTION    . HEMIGLOSSECTOMY  09/26/2011   Procedure: HEMIGLOSSECTOMY;  Surgeon: Melissa Montane, MD;  Location: [facility];  Service: ENT;  Laterality: Left;  . HEMIGLOSSECTOMY  09/26/11   Left, resection-inv squamous cell carcinoma  . TOTAL HIP ARTHROPLASTY     bialt  . TUBAL LIGATION    . WISDOM TOOTH EXTRACTION      OB History    No data available       Home Medications    Prior to  Admission medications   Medication Sig Start Date End Date Taking? Authorizing Provider  acetaminophen (TYLENOL) 325 MG tablet Take 650 mg by mouth every 6 (six) hours as needed for mild pain or moderate pain.    Yes [provider]  aliskiren (TEKTURNA) 300 MG tablet Take 300 mg by mouth daily.   Yes [provider]  amLODipine (NORVASC) 5 MG tablet Take 5 mg by mouth daily.   Yes [provider]  carvedilol (COREG) 6.25 MG tablet Take 12.5 mg by mouth 2 (two) times daily at 10 am and 4 pm.   Yes [provider]  HYDROcodone-acetaminophen (NORCO) 5-325 MG per tablet Take 1 tablet by mouth every 4 (four) hours as needed for moderate pain or severe pain.    Yes [provider]  potassium chloride SA (K-DUR,KLOR-CON) 20 MEQ tablet Take 20 mEq by mouth daily.   Yes [provider]  raloxifene (EVISTA) 60 MG tablet Take 60 mg by mouth daily.   Yes [provider]  ramipril (ALTACE) 10 MG capsule Take 10 mg by mouth daily.   Yes [provider]    Family History Family History  Problem Relation Age of Onset  . Heart attack Mother   . Cancer Father        leukemia  . Cancer  Brother        prostate, brain tumor,lung  . Cancer Daughter        NHL  . Diabetes Maternal Uncle   . Diabetes Maternal Grandfather   . Anesthesia problems Neg Hx     Social History Social History  Substance Use Topics  . Smoking status: Former Smoker    Years: 5.00    Quit date: 07/15/1956  . Smokeless tobacco: Never Used     Comment: does not remember PPD, maybe 5 cigs/day  . Alcohol use No     Allergies   Morphine and related   Review of Systems Review of Systems  Constitutional: Positive for chills. Negative for appetite change.  HENT: Negative for congestion.   Respiratory: Negative for shortness of breath.   Cardiovascular: Negative for chest pain.  Gastrointestinal: Negative for abdominal pain.  Genitourinary: Negative for  flank pain and frequency.  Musculoskeletal: Negative for back pain.  Skin: Positive for wound.  Neurological: Negative for syncope.  Hematological: Negative for adenopathy.  Psychiatric/Behavioral: Negative for confusion.     Physical Exam Updated Vital Signs BP (!) 154/74   Pulse (!) 102   Temp (S) (!) 101.8 F (38.8 C) (Rectal)   Resp 18   Ht 4\' 11"  (1.499 m)   Wt 52.6 kg (116 lb)   SpO2 95%   BMI 23.43 kg/m   Physical Exam  Constitutional: She appears well-developed.  Neck: Neck supple.  Cardiovascular:  Mild tachycardia  Pulmonary/Chest: Effort normal.  Abdominal: Soft. There is no tenderness.  Musculoskeletal: She exhibits edema.  Mild edema bilateral lower extremities. Right lower leg is erythematous with some induration and swelling. There is a ulcer posteriorly that is approximately 1 cm around. Covered with some antibiotic ointment. No fluctuance. Some bruising anteriorly on the lower leg. Neurovascular intact in the foot.  Neurological: She is alert.  Skin: Skin is warm. Capillary refill takes less than 2 seconds.     ED Treatments / Results  Labs (all labs ordered are listed, but only abnormal results are displayed) Labs Reviewed  COMPREHENSIVE METABOLIC PANEL - Abnormal; Notable for the following:       Result Value   Glucose, Bld 106 (*)    BUN 27 (*)    Total Protein 6.4 (*)    All other components within normal limits  CBC WITH DIFFERENTIAL/PLATELET - Abnormal; Notable for the following:    WBC 15.8 (*)    Neutro Abs 13.2 (*)    Monocytes Absolute 1.3 (*)    All other components within normal limits  URINALYSIS, ROUTINE W REFLEX MICROSCOPIC - Abnormal; Notable for the following:    Hgb urine dipstick SMALL (*)    Ketones, ur 5 (*)    Squamous Epithelial / LPF 0-5 (*)    All other components within normal limits  CULTURE, BLOOD (ROUTINE X 2)  CULTURE, BLOOD (ROUTINE X 2)  PROTIME-INR  I-STAT CG4 LACTIC ACID, ED    EKG  EKG  Interpretation None       Radiology No results found.  Procedures Procedures (including critical care time)  Medications Ordered in ED Medications  vancomycin (VANCOCIN) IVPB 1000 mg/200 mL premix (1,000 mg Intravenous New Bag/Given 03/15/17 2349)  sodium chloride 0.9 % bolus 500 mL (0 mLs Intravenous Stopped 03/15/17 2338)  acetaminophen (TYLENOL) tablet 650 mg (650 mg Oral Given 03/15/17 2201)     Initial Impression / Assessment and Plan / ED Course  I have reviewed the triage vital signs and  the nursing notes.  Pertinent labs & imaging results that were available during my care of the patient were reviewed by me and considered in my medical decision making (see chart for details).   patient with cellulitis of lower leg. Fever. Normal lactic acid. With age and comorbidities patient will benefit from admission. Admit to internal medicine.  Final Clinical Impressions(s) / ED Diagnoses   Final diagnoses:  Cellulitis of right leg    New Prescriptions New Prescriptions   No medications on file     Davonna Belling, MD 03/16/17 0004

## 2017-03-16 ENCOUNTER — Observation Stay (HOSPITAL_COMMUNITY): Payer: Medicare Other

## 2017-03-16 ENCOUNTER — Inpatient Hospital Stay (HOSPITAL_COMMUNITY): Payer: Medicare Other

## 2017-03-16 DIAGNOSIS — I517 Cardiomegaly: Secondary | ICD-10-CM | POA: Diagnosis not present

## 2017-03-16 DIAGNOSIS — I248 Other forms of acute ischemic heart disease: Secondary | ICD-10-CM | POA: Diagnosis not present

## 2017-03-16 DIAGNOSIS — Z79899 Other long term (current) drug therapy: Secondary | ICD-10-CM | POA: Diagnosis not present

## 2017-03-16 DIAGNOSIS — Z87891 Personal history of nicotine dependence: Secondary | ICD-10-CM | POA: Diagnosis not present

## 2017-03-16 DIAGNOSIS — M810 Age-related osteoporosis without current pathological fracture: Secondary | ICD-10-CM | POA: Diagnosis not present

## 2017-03-16 DIAGNOSIS — Z806 Family history of leukemia: Secondary | ICD-10-CM | POA: Diagnosis not present

## 2017-03-16 DIAGNOSIS — I340 Nonrheumatic mitral (valve) insufficiency: Secondary | ICD-10-CM | POA: Diagnosis not present

## 2017-03-16 DIAGNOSIS — Z833 Family history of diabetes mellitus: Secondary | ICD-10-CM | POA: Diagnosis not present

## 2017-03-16 DIAGNOSIS — L03115 Cellulitis of right lower limb: Secondary | ICD-10-CM

## 2017-03-16 DIAGNOSIS — I10 Essential (primary) hypertension: Secondary | ICD-10-CM | POA: Diagnosis not present

## 2017-03-16 DIAGNOSIS — R000 Tachycardia, unspecified: Secondary | ICD-10-CM | POA: Diagnosis not present

## 2017-03-16 DIAGNOSIS — Z8249 Family history of ischemic heart disease and other diseases of the circulatory system: Secondary | ICD-10-CM | POA: Diagnosis not present

## 2017-03-16 DIAGNOSIS — R509 Fever, unspecified: Secondary | ICD-10-CM | POA: Diagnosis not present

## 2017-03-16 DIAGNOSIS — L97219 Non-pressure chronic ulcer of right calf with unspecified severity: Secondary | ICD-10-CM | POA: Diagnosis not present

## 2017-03-16 DIAGNOSIS — Z885 Allergy status to narcotic agent status: Secondary | ICD-10-CM | POA: Diagnosis not present

## 2017-03-16 DIAGNOSIS — R739 Hyperglycemia, unspecified: Secondary | ICD-10-CM | POA: Diagnosis not present

## 2017-03-16 DIAGNOSIS — R0989 Other specified symptoms and signs involving the circulatory and respiratory systems: Secondary | ICD-10-CM

## 2017-03-16 DIAGNOSIS — R6510 Systemic inflammatory response syndrome (SIRS) of non-infectious origin without acute organ dysfunction: Secondary | ICD-10-CM | POA: Diagnosis not present

## 2017-03-16 DIAGNOSIS — Z85810 Personal history of malignant neoplasm of tongue: Secondary | ICD-10-CM | POA: Diagnosis not present

## 2017-03-16 LAB — COMPREHENSIVE METABOLIC PANEL
ALT: 22 U/L (ref 14–54)
AST: 30 U/L (ref 15–41)
Albumin: 3.3 g/dL — ABNORMAL LOW (ref 3.5–5.0)
Alkaline Phosphatase: 57 U/L (ref 38–126)
Anion gap: 7 (ref 5–15)
BUN: 23 mg/dL — ABNORMAL HIGH (ref 6–20)
CHLORIDE: 111 mmol/L (ref 101–111)
CO2: 23 mmol/L (ref 22–32)
Calcium: 8.5 mg/dL — ABNORMAL LOW (ref 8.9–10.3)
Creatinine, Ser: 0.76 mg/dL (ref 0.44–1.00)
Glucose, Bld: 157 mg/dL — ABNORMAL HIGH (ref 65–99)
POTASSIUM: 3.5 mmol/L (ref 3.5–5.1)
SODIUM: 141 mmol/L (ref 135–145)
Total Bilirubin: 0.7 mg/dL (ref 0.3–1.2)
Total Protein: 6.1 g/dL — ABNORMAL LOW (ref 6.5–8.1)

## 2017-03-16 LAB — TROPONIN I
TROPONIN I: 0.03 ng/mL — AB (ref <0.03)
Troponin I: 0.04 ng/mL (ref <0.03)
Troponin I: 0.03 ng/mL (ref <0.03)

## 2017-03-16 LAB — CBC
HCT: 38.7 % (ref 36.0–46.0)
Hemoglobin: 12.4 g/dL (ref 12.0–15.0)
MCH: 28.6 pg (ref 26.0–34.0)
MCHC: 32 g/dL (ref 30.0–36.0)
MCV: 89.2 fL (ref 78.0–100.0)
Platelets: 227 10*3/uL (ref 150–400)
RBC: 4.34 MIL/uL (ref 3.87–5.11)
RDW: 14.2 % (ref 11.5–15.5)
WBC: 16.7 10*3/uL — AB (ref 4.0–10.5)

## 2017-03-16 LAB — TSH: TSH: 0.733 u[IU]/mL (ref 0.350–4.500)

## 2017-03-16 LAB — VAS US CAROTID
LCCAPSYS: 111 cm/s
LEFT ECA DIAS: -4 cm/s
LEFT VERTEBRAL DIAS: -11 cm/s
LICADDIAS: -21 cm/s
LICADSYS: -96 cm/s
Left CCA dist dias: -13 cm/s
Left CCA dist sys: -122 cm/s
Left CCA prox dias: 17 cm/s
Left ICA prox dias: -17 cm/s
Left ICA prox sys: -94 cm/s
RCCADSYS: -112 cm/s
RCCAPDIAS: 10 cm/s
RIGHT ECA DIAS: -3 cm/s
RIGHT VERTEBRAL DIAS: -7 cm/s
Right CCA prox sys: 126 cm/s

## 2017-03-16 LAB — ECHOCARDIOGRAM COMPLETE
HEIGHTINCHES: 59 in
Weight: 1897.72 oz

## 2017-03-16 MED ORDER — ONDANSETRON HCL 4 MG/2ML IJ SOLN: 4.0000 mg | Freq: Four times a day (QID) | INTRAMUSCULAR | Status: DC | PRN

## 2017-03-16 MED ORDER — PIPERACILLIN-TAZOBACTAM 3.375 G IVPB
3.3750 g | Freq: Three times a day (TID) | INTRAVENOUS | Status: DC
  Administered 2017-03-16 – 2017-03-17 (×5): 3.375 g via INTRAVENOUS
  Filled 2017-03-16 (×5): qty 50

## 2017-03-16 MED ORDER — ENOXAPARIN SODIUM 40 MG/0.4ML ~~LOC~~ SOLN: 40.0000 mg | SUBCUTANEOUS | Status: DC

## 2017-03-16 MED ORDER — ALISKIREN FUMARATE 150 MG PO TABS
300.0000 mg | ORAL_TABLET | Freq: Every day | ORAL | Status: DC
  Administered 2017-03-16 – 2017-03-17 (×2): 300 mg via ORAL
  Filled 2017-03-16 (×2): qty 2

## 2017-03-16 MED ORDER — RAMIPRIL 10 MG PO CAPS
10.0000 mg | ORAL_CAPSULE | Freq: Every day | ORAL | Status: DC
  Administered 2017-03-16 – 2017-03-17 (×2): 10 mg via ORAL
  Filled 2017-03-16 (×2): qty 1

## 2017-03-16 MED ORDER — ACETAMINOPHEN 325 MG PO TABS: 650.0000 mg | ORAL_TABLET | Freq: Four times a day (QID) | ORAL | Status: DC | PRN

## 2017-03-16 MED ORDER — POTASSIUM CHLORIDE CRYS ER 20 MEQ PO TBCR
20.0000 meq | EXTENDED_RELEASE_TABLET | Freq: Every day | ORAL | Status: DC
  Administered 2017-03-16 – 2017-03-17 (×2): 20 meq via ORAL
  Filled 2017-03-16 (×2): qty 1

## 2017-03-16 MED ORDER — SODIUM CHLORIDE 0.9 % IV SOLN: INTRAVENOUS | Status: DC

## 2017-03-16 MED ORDER — RALOXIFENE HCL 60 MG PO TABS
60.0000 mg | ORAL_TABLET | Freq: Every day | ORAL | Status: DC
Start: 2017-03-16 — End: 2017-03-17
  Administered 2017-03-16 – 2017-03-17 (×2): 60 mg via ORAL
  Filled 2017-03-16 (×2): qty 1

## 2017-03-16 MED ORDER — HYDROCODONE-ACETAMINOPHEN 5-325 MG PO TABS: 1.0000 | ORAL_TABLET | ORAL | Status: DC | PRN

## 2017-03-16 MED ORDER — ACETAMINOPHEN 650 MG RE SUPP: 650.0000 mg | Freq: Four times a day (QID) | RECTAL | Status: DC | PRN

## 2017-03-16 MED ORDER — CARVEDILOL 12.5 MG PO TABS
12.5000 mg | ORAL_TABLET | Freq: Two times a day (BID) | ORAL | Status: DC
  Administered 2017-03-16 – 2017-03-17 (×3): 12.5 mg via ORAL
  Filled 2017-03-16 (×3): qty 1

## 2017-03-16 MED ORDER — SODIUM CHLORIDE 0.9 % IV SOLN
INTRAVENOUS | Status: AC
  Administered 2017-03-16: via INTRAVENOUS

## 2017-03-16 MED ORDER — ONDANSETRON HCL 4 MG PO TABS: 4.0000 mg | ORAL_TABLET | Freq: Four times a day (QID) | ORAL | Status: DC | PRN

## 2017-03-16 MED ORDER — AMLODIPINE BESYLATE 5 MG PO TABS
5.0000 mg | ORAL_TABLET | Freq: Every day | ORAL | Status: DC
  Administered 2017-03-16 – 2017-03-17 (×2): 5 mg via ORAL
  Filled 2017-03-16 (×2): qty 1

## 2017-03-16 MED ORDER — HYDRALAZINE HCL 20 MG/ML IJ SOLN
10.0000 mg | Freq: Four times a day (QID) | INTRAMUSCULAR | Status: DC | PRN
  Administered 2017-03-17: 10 mg via INTRAVENOUS
  Filled 2017-03-16: qty 0.5

## 2017-03-16 MED ORDER — ENOXAPARIN SODIUM 40 MG/0.4ML ~~LOC~~ SOLN
40.0000 mg | Freq: Every day | SUBCUTANEOUS | Status: DC
  Administered 2017-03-16 (×2): 40 mg via SUBCUTANEOUS
  Filled 2017-03-16 (×2): qty 0.4

## 2017-03-16 NOTE — Progress Notes (Signed)
  Echocardiogram 2D Echocardiogram has been performed.  Tresa Res 03/16/2017, 9:40 AM

## 2017-03-16 NOTE — Progress Notes (Signed)
Pharmacy Antibiotic Note  Stephanie [name] is a 83 y.o. female admitted on 03/15/2017 with cellulitis.  Pharmacy has been consulted for zosyn dosing.  Plan: Zosyn 3.375g IV Q8H infused over 4hrs.   Height: 4\' 11"  (149.9 cm) Weight: 116 lb (52.6 kg) IBW/kg (Calculated) : 43.2  Temp (24hrs), Avg:100.2 F (37.9 C), Min:99 F (37.2 C), Max:101.8 F (38.8 C)   Recent Labs Lab 03/15/17 2058 03/15/17 2150  WBC 15.8*  --   CREATININE 0.79  --   LATICACIDVEN  --  0.95    Estimated Creatinine Clearance: 35.4 mL/min (by C-G formula based on SCr of 0.79 mg/dL).    Allergies  Allergen Reactions  . Morphine And Related     Pt states she has never taken but will not take due to husband's severe reaction to Morphine.    Antimicrobials this admission: Vancomycin 03/15/2017 x1 Zosyn 03/16/2017 >>   Dose adjustments this admission: -  Microbiology results: pending  Thank you for allowing pharmacy to be a part of this patient's care.  Nani Skillern Crowford 03/16/2017 12:25 AM

## 2017-03-16 NOTE — Progress Notes (Signed)
CRITICAL VALUE ALERT  Critical Value:  Troponin 0.04  Date & Time Notied:  03/16/17 0230  Provider Notified: Jeannette Corpus  Orders Received/Actions taken: MD also made aware that ordered EKG results are in EPIC.

## 2017-03-16 NOTE — Progress Notes (Signed)
VASCULAR LAB PRELIMINARY  PRELIMINARY  PRELIMINARY  PRELIMINARY  Carotid duplex completed.    Preliminary report:  Bilateral:  1-39% ICA stenosis.  Vertebral artery flow is antegrade.     Blaine Guiffre, RVS 03/16/2017, 1:19 PM

## 2017-03-16 NOTE — Progress Notes (Addendum)
Patient ID: Stephanie [name], female   DOB: 1925-11-10, 83 y.o.   MRN: [id number]  PROGRESS NOTE    Stephanie [name]  TMA:[id number] DOB: 23-Dec-1925 DOA: 03/15/2017  PCP: Lorne Skeens, MD   Brief Narrative:  83 year old female with hypertension, history of squamous cell carcinoma of the tongue. Patient presented with right lower extremity redness, pain for past few weeks prior to this admission. She reports that she was walking in the house and hit herself, initially had small open area on the leg that eventually spread out to involve ankle as well as calf muscle area. She was started on empiric Zosyn.   Assessment & Plan:   Principal Problem: Cellulitis, right lower extremity / leukocytosis / SIRS - SIRS criteria met on the admission with fever, tachycardia, leukocytosis - Source of infection. Right lower extremity cellulitis - Continue Zosyn - Blood cultures are pending  Active Problems: Essential hypertension - Continue Norvasc, carvedilol, ramipril  Mild troponin elevation - Consistent with demand ischemia from acute infectious process - No chest pain   DVT prophylaxis: Lovenox subcutaneous Code Status: full code  Family Communication: No family at the bedside Disposition Plan: Home once cellulitis improves   Consultants:   None   Procedures:   None   Antimicrobials:   Zosyn 03/15/2017 -->   Subjective: No overnight events.  Objective: Vitals:   03/15/17 2350 03/16/17 0019 03/16/17 0100 03/16/17 0431  BP: (!) 154/74 (!) 176/60 (!) 155/52 (!) 151/59  Pulse: (!) 102 (!) 103 86 80  Resp: '18 18  16  ' Temp:  99 F (37.2 C)  98.5 F (36.9 C)  TempSrc:  Oral  Oral  SpO2: 95% 98%  96%  Weight:  54.1 kg (119 lb 4.3 oz)  53.8 kg (118 lb 9.7 oz)  Height:  '4\' 11"'  (1.499 m)      Intake/Output Summary (Last 24 hours) at 03/16/17 0857 Last data filed at 03/16/17 0600  Gross per 24 hour  Intake           973.75 ml  Output              400 ml  Net           573.75  ml   Filed Weights   03/15/17 2145 03/16/17 0019 03/16/17 0431  Weight: 52.6 kg (116 lb) 54.1 kg (119 lb 4.3 oz) 53.8 kg (118 lb 9.7 oz)    Examination:  General exam: Appears calm and comfortable  Respiratory system: Clear to auscultation. Respiratory effort normal. Cardiovascular system: S1 & S2 heard, RRR. Gastrointestinal system: Abdomen is nondistended, soft and nontender. No organomegaly or masses felt. Normal bowel sounds heard. Central nervous system: Alert and oriented. No focal neurological deficits. Extremities: Symmetric 5 x 5 power. Skin: right LE redness, tender and warm to palpation; palpable pulses  Psychiatry: Judgement and insight appear normal. Mood & affect appropriate.   Data Reviewed: I have personally reviewed following labs and imaging studies  CBC:  Recent Labs Lab 03/15/17 2058 03/16/17 0050  WBC 15.8* 16.7*  NEUTROABS 13.2*  --   HGB 12.3 12.4  HCT 38.4 38.7  MCV 89.5 89.2  PLT 233 256   Basic Metabolic Panel:  Recent Labs Lab 03/15/17 2058 03/16/17 0050  NA 140 141  K 3.9 3.5  CL 109 111  CO2 24 23  GLUCOSE 106* 157*  BUN 27* 23*  CREATININE 0.79 0.76  CALCIUM 9.1 8.5*   GFR: Estimated Creatinine Clearance: 35.7 mL/min (by C-G formula based on  SCr of 0.76 mg/dL). Liver Function Tests:  Recent Labs Lab 03/15/17 2058 03/16/17 0050  AST 33 30  ALT 25 22  ALKPHOS 64 57  BILITOT 0.6 0.7  PROT 6.4* 6.1*  ALBUMIN 3.7 3.3*   No results for input(s): LIPASE, AMYLASE in the last 168 hours. No results for input(s): AMMONIA in the last 168 hours. Coagulation Profile:  Recent Labs Lab 03/15/17 2058  INR 0.91   Cardiac Enzymes:  Recent Labs Lab 03/16/17 0050 03/16/17 0556  TROPONINI 0.04* 0.03*   BNP (last 3 results) No results for input(s): PROBNP in the last 8760 hours. HbA1C: No results for input(s): HGBA1C in the last 72 hours. CBG: No results for input(s): GLUCAP in the last 168 hours. Lipid Profile: No results  for input(s): CHOL, HDL, LDLCALC, TRIG, CHOLHDL, LDLDIRECT in the last 72 hours. Thyroid Function Tests:  Recent Labs  03/16/17 0050  TSH 0.733   Anemia Panel: No results for input(s): VITAMINB12, FOLATE, FERRITIN, TIBC, IRON, RETICCTPCT in the last 72 hours. Urine analysis:    Component Value Date/Time   COLORURINE YELLOW 03/15/2017 2058   APPEARANCEUR CLEAR 03/15/2017 2058   LABSPEC 1.013 03/15/2017 2058   PHURINE 6.0 03/15/2017 2058   GLUCOSEU NEGATIVE 03/15/2017 2058   HGBUR SMALL (A) 03/15/2017 2058   BILIRUBINUR NEGATIVE 03/15/2017 2058   KETONESUR 5 (A) 03/15/2017 2058   PROTEINUR NEGATIVE 03/15/2017 2058   UROBILINOGEN 0.2 12/31/2006 1040   NITRITE NEGATIVE 03/15/2017 2058   LEUKOCYTESUR NEGATIVE 03/15/2017 2058   Sepsis Labs: '@LABRCNTIP' (procalcitonin:4,lacticidven:4)   )No results found for this or any previous visit (from the past 240 hour(s)).    Radiology Studies: Dg Chest 2 View  Result Date: 03/16/2017 CLINICAL DATA:  Right leg swelling EXAM: CHEST  2 VIEW COMPARISON:  02/04/2017 FINDINGS: Hyperinflation. Mild cardiomegaly. No large effusion or focal infiltrate. No pneumothorax. Degenerative changes of the shoulders. Stable scoliosis and compression deformity at the thoracolumbar junction. IMPRESSION: Hyperinflation.  Cardiomegaly.  Negative for edema or infiltrate. Electronically Signed   By: Donavan Foil M.D.   On: 03/16/2017 00:08      Scheduled Meds: . aliskiren  300 mg Oral Daily  . amLODipine  5 mg Oral Daily  . carvedilol  12.5 mg Oral BID  . enoxaparin (LOVENOX) injection  40 mg Subcutaneous QHS  . potassium chloride SA  20 mEq Oral Daily  . raloxifene  60 mg Oral Daily  . ramipril  10 mg Oral Daily   Continuous Infusions: . sodium chloride 75 mL/hr at 03/16/17 0021  . piperacillin-tazobactam (ZOSYN)  IV 3.375 g (03/16/17 0851)     LOS: 0 days    Time spent: 25 minutes  Greater than 50% of the time spent on counseling and coordinating  the care.   Leisa Lenz, MD [facility] Pager [phone]  If 7PM-7AM, please contact night-coverage [email or web address] Password [facility] 03/16/2017, 8:57 AM

## 2017-03-17 MED ORDER — CLINDAMYCIN HCL 300 MG PO CAPS: 300.0000 mg | ORAL_CAPSULE | Freq: Three times a day (TID) | ORAL | 0 refills | Status: AC

## 2017-03-17 NOTE — Discharge Instructions (Signed)

## 2017-03-17 NOTE — Discharge Summary (Signed)
Physician Discharge Summary  Stephanie [name] NWG:[id number] DOB: 1925-10-21 DOA: 03/15/2017  PCP: Lorne Skeens, MD  Admit date: 03/15/2017 Discharge date: 03/17/2017  Recommendations for Outpatient Follow-up:  Continue clindamycin for 10 days on discharge for cellulitis. Follow up with PCP in 1-2 weeks to make sure cellulitis is improving.  Discharge Diagnoses:  Principal Problem:   Cellulitis Active Problems:   HTN (hypertension)   Fever   Leukocytosis   Hyperglycemia   Cellulitis of right leg    Discharge Condition: stable   Diet recommendation: as tolerated   History of present illness:  83 year old female with hypertension, history of squamous cell carcinoma of the tongue. Patient presented with right lower extremity redness, pain for past few weeks prior to this admission. She reports that she was walking in the house and hit herself, initially had small open area on the leg that eventually spread out to involve ankle as well as calf muscle area. She was started on empiric Zosyn.  Hospital Course:   Principal Problem: Cellulitis, right lower extremity / leukocytosis / SIRS - SIRS criteria met on the admission with fever, tachycardia, leukocytosis - Source of infection. Right lower extremity cellulitis - Blood cx negative - Stop zosyn - Use clindamycin for 10 days on discharge   Active Problems: Essential hypertension - Continue Norvasc, carvedilol, ramipril - BP stable   Mild troponin elevation - Consistent with demand ischemia from acute infectious process - No chest pain   DVT prophylaxis: Lovenox subQ Code Status: full code  Family Communication: no family at the bedside     Consultants:   None   Procedures:   None   Antimicrobials:   Zosyn 03/15/2017 --> 9/3  Signed:  Leisa Lenz, MD  [facility] 03/17/2017, 11:18 AM  Pager #: [phone]  Time spent in minutes: more than 30 minutes    Discharge Exam: Vitals:   03/17/17  0535 03/17/17 1039  BP: (!) 186/65 (!) 165/84  Pulse: 84 81  Resp: 18   Temp: 99 F (37.2 C)   SpO2: 95%    Vitals:   03/16/17 1948 03/17/17 0443 03/17/17 0535 03/17/17 1039  BP: (!) 163/63  (!) 186/65 (!) 165/84  Pulse: 87  84 81  Resp: 18  18   Temp: 98.6 F (37 C)  99 F (37.2 C)   TempSrc: Oral  Oral   SpO2: 96%  95%   Weight:  52.8 kg (116 lb 6.4 oz)    Height:        General: Pt is alert, follows commands appropriately, not in acute distress Cardiovascular: Regular rate and rhythm, S1/S2 (+) Respiratory: Clear to auscultation bilaterally, no wheezing, no crackles, no rhonchi Abdominal: Soft, non tender, non distended, bowel sounds +, no guarding Extremities: no edema, RLE improved since admission  Neuro: Grossly nonfocal  Discharge Instructions  Discharge Instructions    Call MD for:  redness, tenderness, or signs of infection (pain, swelling, redness, odor or green/yellow discharge around incision site)    Complete by:  As directed    Call MD for:  severe uncontrolled pain    Complete by:  As directed    Diet - low sodium heart healthy    Complete by:  As directed    Discharge instructions    Complete by:  As directed    Continue clindamycin for 10 days on discharge for cellulitis. Follow up with PCP in 1-2 weeks to make sure cellulitis is improving.   Increase activity slowly    Complete  by:  As directed      Allergies as of 03/17/2017      Reactions   Morphine And Related    Pt states she has never taken but will not take due to husband's severe reaction to Morphine.      Medication List    TAKE these medications   acetaminophen 325 MG tablet Commonly known as:  TYLENOL Take 650 mg by mouth every 6 (six) hours as needed for mild pain or moderate pain.   aliskiren 300 MG tablet Commonly known as:  TEKTURNA Take 300 mg by mouth daily.   amLODipine 5 MG tablet Commonly known as:  NORVASC Take 5 mg by mouth daily.   carvedilol 6.25 MG  tablet Commonly known as:  COREG Take 12.5 mg by mouth 2 (two) times daily at 10 am and 4 pm.   clindamycin 300 MG capsule Commonly known as:  CLEOCIN Take 1 capsule (300 mg total) by mouth 3 (three) times daily.   HYDROcodone-acetaminophen 5-325 MG tablet Commonly known as:  NORCO/VICODIN Take 1 tablet by mouth every 4 (four) hours as needed for moderate pain or severe pain.   potassium chloride SA 20 MEQ tablet Commonly known as:  K-DUR,KLOR-CON Take 20 mEq by mouth daily.   raloxifene 60 MG tablet Commonly known as:  EVISTA Take 60 mg by mouth daily.   ramipril 10 MG capsule Commonly known as:  ALTACE Take 10 mg by mouth daily.            Discharge Care Instructions        Start     Ordered   03/17/17 0000  Increase activity slowly     03/17/17 1117   03/17/17 0000  Diet - low sodium heart healthy     03/17/17 1117   03/17/17 0000  Call MD for:  severe uncontrolled pain     03/17/17 1117   03/17/17 0000  Call MD for:  redness, tenderness, or signs of infection (pain, swelling, redness, odor or green/yellow discharge around incision site)     03/17/17 1117   03/17/17 0000  clindamycin (CLEOCIN) 300 MG capsule  3 times daily     03/17/17 1117   03/17/17 0000  Discharge instructions    Comments:  Continue clindamycin for 10 days on discharge for cellulitis. Follow up with PCP in 1-2 weeks to make sure cellulitis is improving.   03/17/17 1117     Follow-up Information    Altheimer, Legrand Como, MD. Schedule an appointment as soon as possible for a visit in 1 week(s).   Specialty:  Endocrinology Contact information: [address] [address] [phone]            The results of significant diagnostics from this hospitalization (including imaging, microbiology, ancillary and laboratory) are listed below for reference.    Significant Diagnostic Studies: Dg Chest 2 View  Result Date: 03/16/2017 CLINICAL DATA:  Right leg swelling EXAM:  CHEST  2 VIEW COMPARISON:  02/04/2017 FINDINGS: Hyperinflation. Mild cardiomegaly. No large effusion or focal infiltrate. No pneumothorax. Degenerative changes of the shoulders. Stable scoliosis and compression deformity at the thoracolumbar junction. IMPRESSION: Hyperinflation.  Cardiomegaly.  Negative for edema or infiltrate. Electronically Signed   By: Donavan Foil M.D.   On: 03/16/2017 00:08    Microbiology: Recent Results (from the past 240 hour(s))  Culture, blood (Routine x 2)     Status: None (Preliminary result)   Collection Time: 03/15/17  8:58 PM  Result Value  Ref Range Status   Specimen Description BLOOD LEFT ANTECUBITAL  Final   Special Requests IN PEDIATRIC BOTTLE Blood Culture adequate volume  Final   Culture   Final    NO GROWTH 1 DAY Performed at [facility], [place] [address], [place], [place] [address]    Report Status PENDING  Incomplete  Culture, blood (Routine x 2)     Status: None (Preliminary result)   Collection Time: 03/15/17  9:03 PM  Result Value Ref Range Status   Specimen Description BLOOD BLOOD RIGHT FOREARM  Final   Special Requests   Final    BOTTLES DRAWN AEROBIC AND ANAEROBIC Blood Culture adequate volume   Culture   Final    NO GROWTH 1 DAY Performed at [facility], [place] [address] [place], [place] [address]    Report Status PENDING  Incomplete     Labs: Basic Metabolic Panel:  Recent Labs Lab 03/15/17 2058 03/16/17 0050  NA 140 141  K 3.9 3.5  CL 109 111  CO2 24 23  GLUCOSE 106* 157*  BUN 27* 23*  CREATININE 0.79 0.76  CALCIUM 9.1 8.5*   Liver Function Tests:  Recent Labs Lab 03/15/17 2058 03/16/17 0050  AST 33 30  ALT 25 22  ALKPHOS 64 57  BILITOT 0.6 0.7  PROT 6.4* 6.1*  ALBUMIN 3.7 3.3*   No results for input(s): LIPASE, AMYLASE in the last 168 hours. No results for input(s): AMMONIA in the last 168 hours. CBC:  Recent Labs Lab 03/15/17 2058 03/16/17 0050  WBC 15.8* 16.7*  NEUTROABS 13.2*  --   HGB  12.3 12.4  HCT 38.4 38.7  MCV 89.5 89.2  PLT 233 227   Cardiac Enzymes:  Recent Labs Lab 03/16/17 0050 03/16/17 0556 03/16/17 1414  TROPONINI 0.04* 0.03* 0.03*   BNP: BNP (last 3 results) No results for input(s): BNP in the last 8760 hours.  ProBNP (last 3 results) No results for input(s): PROBNP in the last 8760 hours.  CBG: No results for input(s): GLUCAP in the last 168 hours.

## 2017-03-17 NOTE — Progress Notes (Signed)
Patient is A&Ox4 and ambulatory without assist. Discharge instructions reviewed by secondary RN. Pt denied questions/concerns r/t instruction.  Pt to schedule Post DC f/u.

## 2017-03-21 LAB — CULTURE, BLOOD (ROUTINE X 2)
CULTURE: NO GROWTH
Culture: NO GROWTH
Special Requests: ADEQUATE
Special Requests: ADEQUATE

## 2017-03-28 DIAGNOSIS — L03115 Cellulitis of right lower limb: Secondary | ICD-10-CM | POA: Diagnosis not present

## 2017-03-28 DIAGNOSIS — I10 Essential (primary) hypertension: Secondary | ICD-10-CM | POA: Diagnosis not present

## 2017-03-28 DIAGNOSIS — M810 Age-related osteoporosis without current pathological fracture: Secondary | ICD-10-CM | POA: Diagnosis not present

## 2017-03-28 DIAGNOSIS — Z23 Encounter for immunization: Secondary | ICD-10-CM | POA: Diagnosis not present

## 2017-03-28 DIAGNOSIS — R7301 Impaired fasting glucose: Secondary | ICD-10-CM | POA: Diagnosis not present

## 2017-03-28 DIAGNOSIS — M150 Primary generalized (osteo)arthritis: Secondary | ICD-10-CM | POA: Diagnosis not present

## 2017-03-28 DIAGNOSIS — E782 Mixed hyperlipidemia: Secondary | ICD-10-CM | POA: Diagnosis not present

## 2017-04-21 DIAGNOSIS — R7301 Impaired fasting glucose: Secondary | ICD-10-CM | POA: Diagnosis not present

## 2017-04-21 DIAGNOSIS — I10 Essential (primary) hypertension: Secondary | ICD-10-CM | POA: Diagnosis not present

## 2017-04-22 DIAGNOSIS — L03115 Cellulitis of right lower limb: Secondary | ICD-10-CM | POA: Diagnosis not present

## 2017-04-22 DIAGNOSIS — I10 Essential (primary) hypertension: Secondary | ICD-10-CM | POA: Diagnosis not present

## 2017-04-22 DIAGNOSIS — M810 Age-related osteoporosis without current pathological fracture: Secondary | ICD-10-CM | POA: Diagnosis not present

## 2017-04-22 DIAGNOSIS — M150 Primary generalized (osteo)arthritis: Secondary | ICD-10-CM | POA: Diagnosis not present

## 2017-04-22 DIAGNOSIS — R7301 Impaired fasting glucose: Secondary | ICD-10-CM | POA: Diagnosis not present

## 2017-04-22 DIAGNOSIS — E782 Mixed hyperlipidemia: Secondary | ICD-10-CM | POA: Diagnosis not present

## 2017-04-28 DIAGNOSIS — M25552 Pain in left hip: Secondary | ICD-10-CM | POA: Diagnosis not present

## 2017-04-28 DIAGNOSIS — Z96643 Presence of artificial hip joint, bilateral: Secondary | ICD-10-CM | POA: Diagnosis not present

## 2017-07-21 DIAGNOSIS — I10 Essential (primary) hypertension: Secondary | ICD-10-CM | POA: Diagnosis not present

## 2017-07-21 DIAGNOSIS — M150 Primary generalized (osteo)arthritis: Secondary | ICD-10-CM | POA: Diagnosis not present

## 2017-07-21 DIAGNOSIS — R7301 Impaired fasting glucose: Secondary | ICD-10-CM | POA: Diagnosis not present

## 2017-07-22 DIAGNOSIS — M150 Primary generalized (osteo)arthritis: Secondary | ICD-10-CM | POA: Diagnosis not present

## 2017-07-22 DIAGNOSIS — R7301 Impaired fasting glucose: Secondary | ICD-10-CM | POA: Diagnosis not present

## 2017-07-22 DIAGNOSIS — L03115 Cellulitis of right lower limb: Secondary | ICD-10-CM | POA: Diagnosis not present

## 2017-07-22 DIAGNOSIS — M810 Age-related osteoporosis without current pathological fracture: Secondary | ICD-10-CM | POA: Diagnosis not present

## 2017-07-22 DIAGNOSIS — E782 Mixed hyperlipidemia: Secondary | ICD-10-CM | POA: Diagnosis not present

## 2017-07-22 DIAGNOSIS — I10 Essential (primary) hypertension: Secondary | ICD-10-CM | POA: Diagnosis not present

## 2017-09-10 DIAGNOSIS — Z1231 Encounter for screening mammogram for malignant neoplasm of breast: Secondary | ICD-10-CM | POA: Diagnosis not present

## 2017-10-16 DIAGNOSIS — E782 Mixed hyperlipidemia: Secondary | ICD-10-CM | POA: Diagnosis not present

## 2017-10-16 DIAGNOSIS — M150 Primary generalized (osteo)arthritis: Secondary | ICD-10-CM | POA: Diagnosis not present

## 2017-10-16 DIAGNOSIS — I10 Essential (primary) hypertension: Secondary | ICD-10-CM | POA: Diagnosis not present

## 2017-10-23 DIAGNOSIS — M810 Age-related osteoporosis without current pathological fracture: Secondary | ICD-10-CM | POA: Diagnosis not present

## 2017-10-23 DIAGNOSIS — E782 Mixed hyperlipidemia: Secondary | ICD-10-CM | POA: Diagnosis not present

## 2017-10-23 DIAGNOSIS — M150 Primary generalized (osteo)arthritis: Secondary | ICD-10-CM | POA: Diagnosis not present

## 2017-10-23 DIAGNOSIS — I10 Essential (primary) hypertension: Secondary | ICD-10-CM | POA: Diagnosis not present

## 2017-10-23 DIAGNOSIS — L03115 Cellulitis of right lower limb: Secondary | ICD-10-CM | POA: Diagnosis not present

## 2017-10-23 DIAGNOSIS — R7301 Impaired fasting glucose: Secondary | ICD-10-CM | POA: Diagnosis not present

## 2017-10-28 DIAGNOSIS — Z85828 Personal history of other malignant neoplasm of skin: Secondary | ICD-10-CM | POA: Diagnosis not present

## 2017-10-28 DIAGNOSIS — D1801 Hemangioma of skin and subcutaneous tissue: Secondary | ICD-10-CM | POA: Diagnosis not present

## 2017-10-28 DIAGNOSIS — L821 Other seborrheic keratosis: Secondary | ICD-10-CM | POA: Diagnosis not present

## 2017-10-28 DIAGNOSIS — D2371 Other benign neoplasm of skin of right lower limb, including hip: Secondary | ICD-10-CM | POA: Diagnosis not present

## 2017-10-28 DIAGNOSIS — D485 Neoplasm of uncertain behavior of skin: Secondary | ICD-10-CM | POA: Diagnosis not present

## 2017-10-28 DIAGNOSIS — D2271 Melanocytic nevi of right lower limb, including hip: Secondary | ICD-10-CM | POA: Diagnosis not present

## 2017-10-28 DIAGNOSIS — D225 Melanocytic nevi of trunk: Secondary | ICD-10-CM | POA: Diagnosis not present

## 2017-10-28 DIAGNOSIS — Z86018 Personal history of other benign neoplasm: Secondary | ICD-10-CM | POA: Diagnosis not present

## 2017-10-28 DIAGNOSIS — L409 Psoriasis, unspecified: Secondary | ICD-10-CM | POA: Diagnosis not present

## 2017-10-28 DIAGNOSIS — L814 Other melanin hyperpigmentation: Secondary | ICD-10-CM | POA: Diagnosis not present

## 2017-10-29 DIAGNOSIS — D485 Neoplasm of uncertain behavior of skin: Secondary | ICD-10-CM | POA: Diagnosis not present

## 2017-10-29 DIAGNOSIS — D2371 Other benign neoplasm of skin of right lower limb, including hip: Secondary | ICD-10-CM | POA: Diagnosis not present

## 2018-01-20 DIAGNOSIS — I10 Essential (primary) hypertension: Secondary | ICD-10-CM | POA: Diagnosis not present

## 2018-01-27 DIAGNOSIS — L03115 Cellulitis of right lower limb: Secondary | ICD-10-CM | POA: Diagnosis not present

## 2018-01-27 DIAGNOSIS — M810 Age-related osteoporosis without current pathological fracture: Secondary | ICD-10-CM | POA: Diagnosis not present

## 2018-01-27 DIAGNOSIS — I10 Essential (primary) hypertension: Secondary | ICD-10-CM | POA: Diagnosis not present

## 2018-01-27 DIAGNOSIS — E782 Mixed hyperlipidemia: Secondary | ICD-10-CM | POA: Diagnosis not present

## 2018-01-27 DIAGNOSIS — R7301 Impaired fasting glucose: Secondary | ICD-10-CM | POA: Diagnosis not present

## 2018-01-27 DIAGNOSIS — M150 Primary generalized (osteo)arthritis: Secondary | ICD-10-CM | POA: Diagnosis not present

## 2018-04-22 IMAGING — DX DG CHEST 2V
2 series · 2 of 2 positions shown · non-contrast
Comparison: Chest x-ray of 02/14/2015

CLINICAL DATA: History of carcinoma of the tongue, hypertension

EXAM:
CHEST  2 VIEW

[dg chest 2 view (1 of 2)]
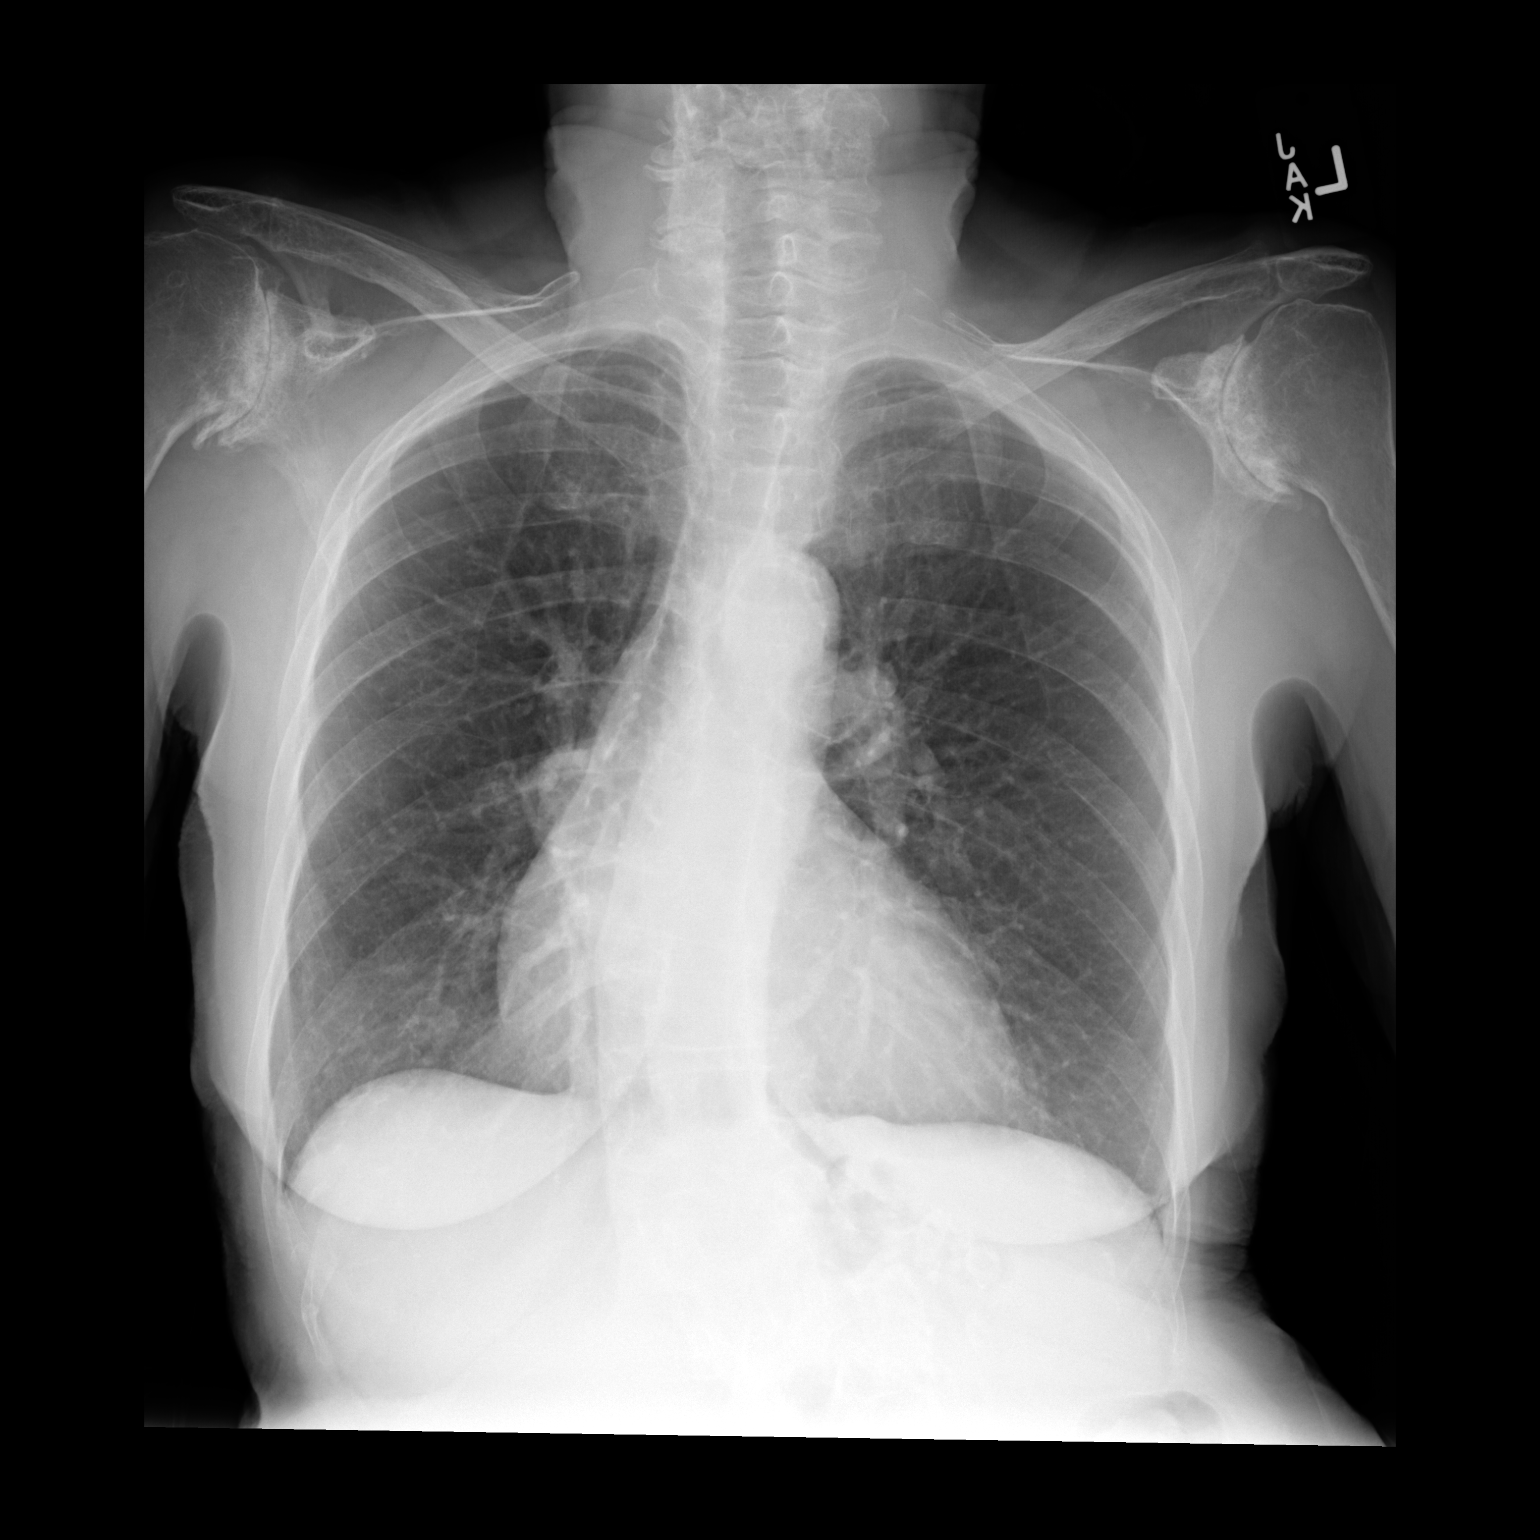

[dg chest 2 view (2 of 2)]
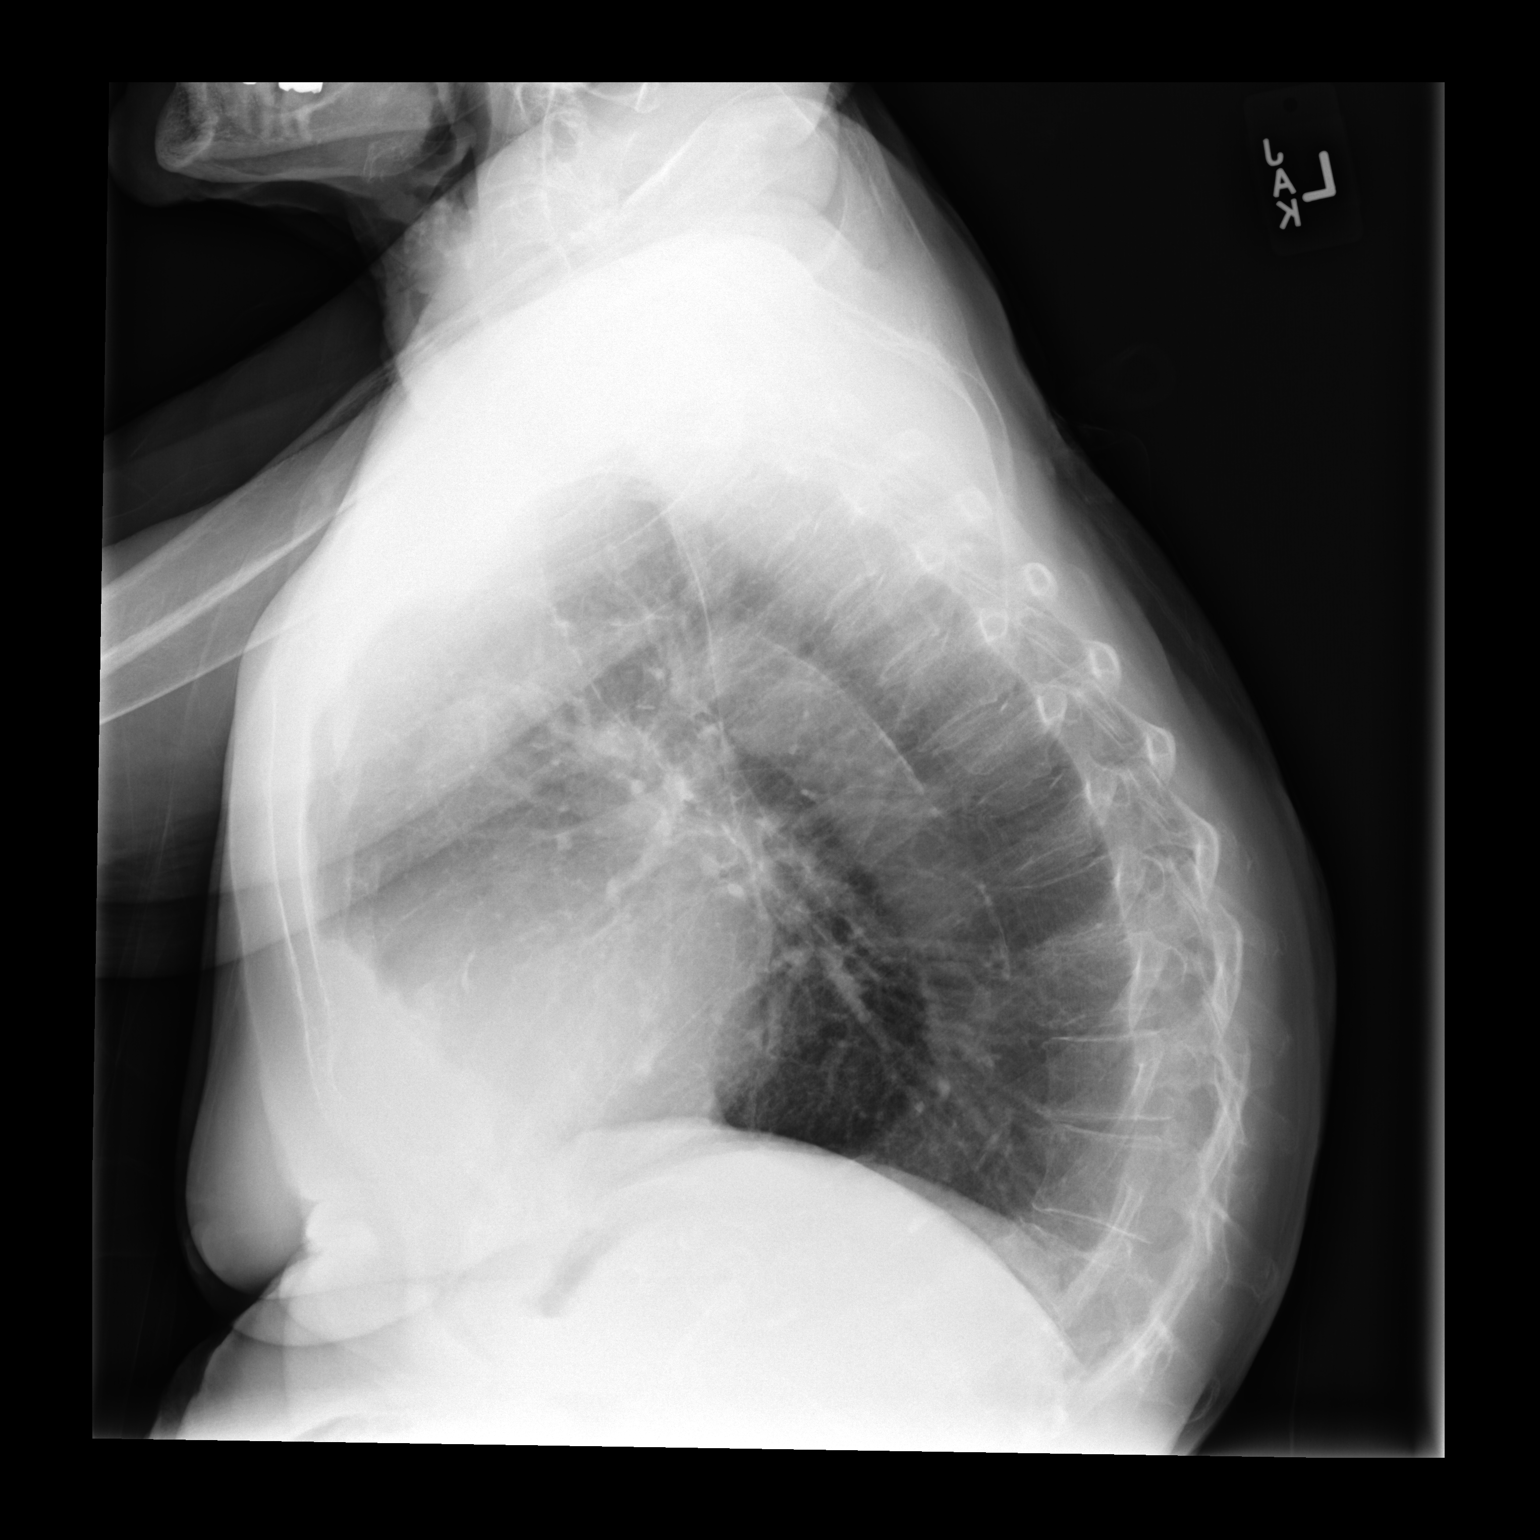

[2 of 2 positions shown; findings below may reference images not displayed]

FINDINGS: The lungs remain clear but hyperaerated with increased a patent AP
diameter suggestive of emphysema. No pneumonia or effusion is seen.
Mediastinal and hilar contours are unremarkable. Mild cardiomegaly
is stable. There is mild to moderate thoracic aortic atherosclerosis
present. Significant degenerative joint disease involves both
shoulders. A partially compressed lower thoracic vertebral body
appears stable.
IMPRESSION: 1. No definite active process.  Emphysema.
2. Severe degenerative joint disease of both hips.
3. Stable cardiomegaly

## 2018-05-04 DIAGNOSIS — M810 Age-related osteoporosis without current pathological fracture: Secondary | ICD-10-CM | POA: Diagnosis not present

## 2018-05-04 DIAGNOSIS — I10 Essential (primary) hypertension: Secondary | ICD-10-CM | POA: Diagnosis not present

## 2018-05-04 DIAGNOSIS — R7301 Impaired fasting glucose: Secondary | ICD-10-CM | POA: Diagnosis not present

## 2018-05-07 DIAGNOSIS — R7301 Impaired fasting glucose: Secondary | ICD-10-CM | POA: Diagnosis not present

## 2018-05-07 DIAGNOSIS — E782 Mixed hyperlipidemia: Secondary | ICD-10-CM | POA: Diagnosis not present

## 2018-05-07 DIAGNOSIS — M150 Primary generalized (osteo)arthritis: Secondary | ICD-10-CM | POA: Diagnosis not present

## 2018-05-07 DIAGNOSIS — L03115 Cellulitis of right lower limb: Secondary | ICD-10-CM | POA: Diagnosis not present

## 2018-05-07 DIAGNOSIS — I10 Essential (primary) hypertension: Secondary | ICD-10-CM | POA: Diagnosis not present

## 2018-05-07 DIAGNOSIS — M810 Age-related osteoporosis without current pathological fracture: Secondary | ICD-10-CM | POA: Diagnosis not present

## 2018-05-28 DIAGNOSIS — Z23 Encounter for immunization: Secondary | ICD-10-CM | POA: Diagnosis not present

## 2018-07-02 ENCOUNTER — Encounter (HOSPITAL_COMMUNITY): Payer: Self-pay

## 2018-07-02 ENCOUNTER — Emergency Department (HOSPITAL_COMMUNITY)
Admission: EM | Admit: 2018-07-02 | Discharge: 2018-07-02 | Disposition: A | Discharge status: Home / Self Care | Payer: Medicare Other | Attending: Emergency Medicine | Admitting: Emergency Medicine

## 2018-07-02 ENCOUNTER — Other Ambulatory Visit: Payer: Self-pay

## 2018-07-02 ENCOUNTER — Ambulatory Visit: Payer: Self-pay | Admitting: *Deleted

## 2018-07-02 DIAGNOSIS — Z87891 Personal history of nicotine dependence: Secondary | ICD-10-CM | POA: Insufficient documentation

## 2018-07-02 DIAGNOSIS — L03115 Cellulitis of right lower limb: Secondary | ICD-10-CM | POA: Insufficient documentation

## 2018-07-02 DIAGNOSIS — R2243 Localized swelling, mass and lump, lower limb, bilateral: Secondary | ICD-10-CM | POA: Diagnosis present

## 2018-07-02 DIAGNOSIS — L03116 Cellulitis of left lower limb: Secondary | ICD-10-CM | POA: Diagnosis not present

## 2018-07-02 DIAGNOSIS — I10 Essential (primary) hypertension: Secondary | ICD-10-CM | POA: Diagnosis not present

## 2018-07-02 DIAGNOSIS — Z96643 Presence of artificial hip joint, bilateral: Secondary | ICD-10-CM | POA: Diagnosis not present

## 2018-07-02 DIAGNOSIS — L03119 Cellulitis of unspecified part of limb: Secondary | ICD-10-CM

## 2018-07-02 DIAGNOSIS — Z79899 Other long term (current) drug therapy: Secondary | ICD-10-CM | POA: Diagnosis not present

## 2018-07-02 LAB — CBC
HEMATOCRIT: 37 % (ref 36.0–46.0)
HEMOGLOBIN: 11.2 g/dL — AB (ref 12.0–15.0)
MCH: 27.6 pg (ref 26.0–34.0)
MCHC: 30.3 g/dL (ref 30.0–36.0)
MCV: 91.1 fL (ref 80.0–100.0)
NRBC: 0 % (ref 0.0–0.2)
Platelets: 208 10*3/uL (ref 150–400)
RBC: 4.06 MIL/uL (ref 3.87–5.11)
RDW: 13.6 % (ref 11.5–15.5)
WBC: 7.3 10*3/uL (ref 4.0–10.5)

## 2018-07-02 LAB — I-STAT CHEM 8, ED
BUN: 27 mg/dL — AB (ref 8–23)
CALCIUM ION: 1.23 mmol/L (ref 1.15–1.40)
CHLORIDE: 109 mmol/L (ref 98–111)
Creatinine, Ser: 1 mg/dL (ref 0.44–1.00)
Glucose, Bld: 110 mg/dL — ABNORMAL HIGH (ref 70–99)
HEMATOCRIT: 34 % — AB (ref 36.0–46.0)
Hemoglobin: 11.6 g/dL — ABNORMAL LOW (ref 12.0–15.0)
POTASSIUM: 3.9 mmol/L (ref 3.5–5.1)
Sodium: 143 mmol/L (ref 135–145)
TCO2: 27 mmol/L (ref 22–32)

## 2018-07-02 MED ORDER — CEPHALEXIN 500 MG PO CAPS: 500.0000 mg | ORAL_CAPSULE | Freq: Four times a day (QID) | ORAL | 0 refills | Status: DC

## 2018-07-02 MED ORDER — SULFAMETHOXAZOLE-TRIMETHOPRIM 800-160 MG PO TABS: 1.0000 | ORAL_TABLET | Freq: Two times a day (BID) | ORAL | 0 refills | Status: AC

## 2018-07-02 MED ORDER — SULFAMETHOXAZOLE-TRIMETHOPRIM 800-160 MG PO TABS
1.0000 | ORAL_TABLET | Freq: Once | ORAL | Status: AC
  Administered 2018-07-02: 1 via ORAL
  Filled 2018-07-02: qty 1

## 2018-07-02 MED ORDER — CEPHALEXIN 500 MG PO CAPS
500.0000 mg | ORAL_CAPSULE | Freq: Once | ORAL | Status: AC
  Administered 2018-07-02: 500 mg via ORAL
  Filled 2018-07-02: qty 1

## 2018-07-02 NOTE — ED Triage Notes (Signed)
Patient c/o bilateral lower leg swelling and redness x a month or more. Patient states she has been been putting an antifungal cream on her legs and redness and swelling have gotten worse in the past week.

## 2018-07-02 NOTE — ED Provider Notes (Signed)
[facility] DEPT Provider Note   CSN: [id number] Arrival date & time: 07/02/18  1701     History   Chief Complaint Chief Complaint  Patient presents with  . Leg Swelling  . leg redness    HPI Stephanie [name] is a 84 y.o. female.  HPI Patient presents with lower extremity swelling and redness.  Has had somewhat chronic swelling but worse over the last couple months.  However now more red over the last week.  No fevers.  States she has had blood poisoning before from this and is worried that is going to happen now.  No fevers.  No chest pain or trouble breathing. Past Medical History:  Diagnosis Date  . Arthritis   . Blood transfusion    Last hip surgery.  . Cancer ([place]) 09/11/11   tongue-squamous cell  . Hypertension   . 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 Cellulitis of right leg   . Cellulitis 03/15/2017  . Fever 03/15/2017  . Leukocytosis 03/15/2017  . Hyperglycemia 03/15/2017  . Malignant neoplasm of anterior two-thirds of tongue, part unspecified ([place]) 09/29/2011  . Tongue cancer ([place])   . Arthritis   . Cancer ([place]) 09/11/2011  . HTN (hypertension) 08/16/2011    Past Surgical History:  Procedure Laterality Date  . APPENDECTOMY     w/BTL  . CATARACT EXTRACTION    . HEMIGLOSSECTOMY  09/26/2011   Procedure: HEMIGLOSSECTOMY;  Surgeon: Melissa Montane, MD;  Location: [facility];  Service: ENT;  Laterality: Left;  . HEMIGLOSSECTOMY  09/26/11   Left, resection-inv squamous cell carcinoma  . TOTAL HIP ARTHROPLASTY     bialt  . TUBAL LIGATION    . WISDOM TOOTH EXTRACTION       OB History   No obstetric history on file.      Home Medications    Prior to Admission medications   Medication Sig Start Date End Date Taking? Authorizing Provider  aliskiren (TEKTURNA) 300 MG tablet Take 300 mg by mouth daily.   Yes [provider]  amLODipine (NORVASC) 5 MG  tablet Take 5 mg by mouth daily.   Yes [provider]  carvedilol (COREG) 6.25 MG tablet Take 12.5 mg by mouth 2 (two) times daily.    Yes [provider]  cephALEXin (KEFLEX) 500 MG capsule Take 2,000 mg by mouth as directed. Take 4 tablets (2000 mg) prior to dental appointments   Yes [provider]  HYDROcodone-acetaminophen (NORCO) 5-325 MG per tablet Take 1 tablet by mouth every 4 (four) hours as needed for moderate pain or severe pain.    Yes [provider]  potassium chloride SA (K-DUR,KLOR-CON) 20 MEQ tablet Take 20 mEq by mouth daily.   Yes [provider]  raloxifene (EVISTA) 60 MG tablet Take 60 mg by mouth daily.   Yes [provider]  ramipril (ALTACE) 10 MG capsule Take 10 mg by mouth daily.   Yes [provider]  cephALEXin (KEFLEX) 500 MG capsule Take 1 capsule (500 mg total) by mouth 4 (four) times daily. 07/02/18   Davonna Belling, MD  sulfamethoxazole-trimethoprim (BACTRIM DS,SEPTRA DS) 800-160 MG tablet Take 1 tablet by mouth 2 (two) times daily for 7 days. 07/02/18 07/09/18  Davonna Belling, MD    Family History Family History  Problem Relation Age of Onset  . Heart attack Mother   . Cancer Father  leukemia  . Cancer Brother        prostate, brain tumor,lung  . Cancer Daughter        NHL  . Diabetes Maternal Uncle   . Diabetes Maternal Grandfather   . Anesthesia problems Neg Hx     Social History Social History   Tobacco Use  . Smoking status: Former Smoker    Years: 5.00    Last attempt to quit: 07/15/1956    Years since quitting: 62.0  . Smokeless tobacco: Never Used  . Tobacco comment: does not remember PPD, maybe 5 cigs/day  Substance Use Topics  . Alcohol use: No  . Drug use: No     Allergies   Morphine and related   Review of Systems Review of Systems  Constitutional: Negative for appetite change.  HENT: Negative for congestion.   Respiratory: Negative for shortness of  breath.   Cardiovascular: Positive for leg swelling.  Gastrointestinal: Negative for abdominal pain.  Genitourinary: Negative for flank pain.  Musculoskeletal: Negative for back pain.  Skin: Positive for color change.  Neurological: Negative for weakness.  Psychiatric/Behavioral: Negative for agitation.     Physical Exam Updated Vital Signs BP (!) 167/80 (BP Location: Left Arm)   Pulse 68   Temp 98 F (36.7 C) (Oral)   Resp 17   Ht 4\' 11"  (1.499 m)   Wt 50.8 kg   SpO2 97%   BMI 22.62 kg/m   Physical Exam HENT:     Head: Atraumatic.     Mouth/Throat:     Mouth: Mucous membranes are moist.  Neck:     Musculoskeletal: No neck rigidity.  Cardiovascular:     Rate and Rhythm: Normal rate.  Pulmonary:     Breath sounds: No rales.  Abdominal:     Tenderness: There is no abdominal tenderness.  Musculoskeletal:     Right lower leg: Edema present.     Left lower leg: Edema present.     Comments: Pitting edema bilateral lower extremities.  There is erythema on the lower leg bilaterally.  There seems to be a more intense area medially on bilateral legs.  No purulent drainage.  No fluctuance.  Pulse intact in foot.  Skin:    General: Skin is warm.     Capillary Refill: Capillary refill takes less than 2 seconds.  Neurological:     General: No focal deficit present.     Mental Status: She is alert.      ED Treatments / Results  Labs (all labs ordered are listed, but only abnormal results are displayed) Labs Reviewed  CBC - Abnormal; Notable for the following components:      Result Value   Hemoglobin 11.2 (*)    All other components within normal limits  I-STAT CHEM 8, ED - Abnormal; Notable for the following components:   BUN 27 (*)    Glucose, Bld 110 (*)    Hemoglobin 11.6 (*)    HCT 34.0 (*)    All other components within normal limits    EKG None  Radiology No results found.  Procedures Procedures (including critical care time)  Medications Ordered in  ED Medications  sulfamethoxazole-trimethoprim (BACTRIM DS,SEPTRA DS) 800-160 MG per tablet 1 tablet (has no administration in time range)  cephALEXin (KEFLEX) capsule 500 mg (has no administration in time range)     Initial Impression / Assessment and Plan / ED Course  I have reviewed the triage vital signs and the nursing notes.  Pertinent labs &  imaging results that were available during my care of the patient were reviewed by me and considered in my medical decision making (see chart for details).     Patient with apparent cellulitis of bilateral lower legs.  Has some chronic edema.  Labs reassuring.  Will treat with antibiotics.  Discharge home to follow-up with PCP.  Final Clinical Impressions(s) / ED Diagnoses   Final diagnoses:  Cellulitis of lower extremity, unspecified laterality    ED Discharge Orders         Ordered    sulfamethoxazole-trimethoprim (BACTRIM DS,SEPTRA DS) 800-160 MG tablet  2 times daily     07/02/18 2018    cephALEXin (KEFLEX) 500 MG capsule  4 times daily     07/02/18 2018           Davonna Belling, MD 07/02/18 2018

## 2018-07-02 NOTE — Telephone Encounter (Signed)
Pt calling with complaints of two areas on her left and right leg that look like a fungus. Pt states that she has experienced a burning sensation to the legs for the last day or tow and her leg feels feverish. Pt states that the area "looks like infection".Pt states that she has swelling in both legs during the day at baseline but her legs look more swollen around the ankles were the to spots are located. Pt states that she does not have streaks but the area around the ankle is red. Pt concerned that she may have blood poisoning due to having it previously in September.Pt advised to contact PCP but pt states that the PCP is going to tell her to contact the dermatologist. Pt states she contacted the dermatologist prior to calling the office and was told the earliest available appt was on 07/21/18. Pt advised to seek treatment in the ED for current symptoms. Pt verbalized understanding.  Reason for Disposition . [1] Looks infected (spreading redness, pus) AND [2] large red area (> 2 in. or 5 cm)  Answer Assessment - Initial Assessment Questions 1. ONSET: "When did the pain start?"      *No Answer* 2. LOCATION: "Where is the pain located?"      *No Answer* 3. PAIN: "How bad is the pain?"    (Scale 1-10; or mild, moderate, severe)   -  MILD (1-3): doesn't interfere with normal activities    -  MODERATE (4-7): interferes with normal activities (e.g., work or school) or awakens from sleep, limping    -  SEVERE (8-10): excruciating pain, unable to do any normal activities, unable to walk     Not really pain just burning 4. WORK OR EXERCISE: "Has there been any recent work or exercise that involved this part of the body?"      *No Answer* 5. CAUSE: "What do you think is causing the leg pain?"     *No Answer* 6. OTHER SYMPTOMS: "Do you have any other symptoms?" (e.g., chest pain, back pain, breathing difficulty, swelling, rash, fever, numbness, weakness)     No 7. PREGNANCY: "Is there any chance you are  pregnant?" "When was your last menstrual period?"     n/a  Answer Assessment - Initial Assessment Questions 1. APPEARANCE of SORES: "What do the sores look like?"     "look like a fungus", sores are circular and pt originally thought that it was ring worm  2. NUMBER: "How many sores are there?"     2 areas on on each leg in the inside part of ankle 3. SIZE: "How big is the largest sore?"     Pt states the area on the left leg is bigger than the left, pt states that the area is circular but exact size not specified 4. LOCATION: "Where are the sores located?"     On the inner ankle of left and right leg 5. ONSET: "When did the sores begin?"     For the last day or two, has been using fungus cream previously and the area went away but now it is back and also has burning 6. CAUSE: "What do you think is causing the sores?"     Pt has concerned that she has blood poisoning. Pt states she had this in September and states she feels the same way now 7. OTHER SYMPTOMS: "Do you have any other symptoms?" (e.g., fever, new weakness)     Pt states that the leg feels "feverish" and  looks like infection. Pt denies any shortness of breath, chest pain or other symptoms at this time.  Protocols used: SORES-A-AH, LEG PAIN-A-AH

## 2018-07-10 ENCOUNTER — Emergency Department (HOSPITAL_COMMUNITY): Payer: Medicare Other

## 2018-07-10 ENCOUNTER — Emergency Department (HOSPITAL_COMMUNITY)
Admission: EM | Admit: 2018-07-10 | Discharge: 2018-07-10 | Disposition: A | Discharge status: Home / Self Care | Payer: Medicare Other | Attending: Emergency Medicine | Admitting: Emergency Medicine

## 2018-07-10 ENCOUNTER — Encounter (HOSPITAL_COMMUNITY): Payer: Self-pay | Admitting: Emergency Medicine

## 2018-07-10 DIAGNOSIS — Z87891 Personal history of nicotine dependence: Secondary | ICD-10-CM | POA: Diagnosis not present

## 2018-07-10 DIAGNOSIS — Z79899 Other long term (current) drug therapy: Secondary | ICD-10-CM | POA: Diagnosis not present

## 2018-07-10 DIAGNOSIS — M7989 Other specified soft tissue disorders: Secondary | ICD-10-CM | POA: Insufficient documentation

## 2018-07-10 DIAGNOSIS — Z96643 Presence of artificial hip joint, bilateral: Secondary | ICD-10-CM | POA: Insufficient documentation

## 2018-07-10 DIAGNOSIS — R600 Localized edema: Secondary | ICD-10-CM | POA: Diagnosis present

## 2018-07-10 DIAGNOSIS — I10 Essential (primary) hypertension: Secondary | ICD-10-CM | POA: Diagnosis not present

## 2018-07-10 DIAGNOSIS — J90 Pleural effusion, not elsewhere classified: Secondary | ICD-10-CM | POA: Diagnosis not present

## 2018-07-10 LAB — CBC WITH DIFFERENTIAL/PLATELET
ABS IMMATURE GRANULOCYTES: 0.04 10*3/uL (ref 0.00–0.07)
BASOS ABS: 0.1 10*3/uL (ref 0.0–0.1)
Basophils Relative: 1 %
Eosinophils Absolute: 0.1 10*3/uL (ref 0.0–0.5)
Eosinophils Relative: 2 %
HEMATOCRIT: 34.3 % — AB (ref 36.0–46.0)
HEMOGLOBIN: 10.6 g/dL — AB (ref 12.0–15.0)
Immature Granulocytes: 1 %
LYMPHS ABS: 1 10*3/uL (ref 0.7–4.0)
LYMPHS PCT: 16 %
MCH: 28.5 pg (ref 26.0–34.0)
MCHC: 30.9 g/dL (ref 30.0–36.0)
MCV: 92.2 fL (ref 80.0–100.0)
Monocytes Absolute: 0.6 10*3/uL (ref 0.1–1.0)
Monocytes Relative: 9 %
NEUTROS ABS: 4.4 10*3/uL (ref 1.7–7.7)
NRBC: 0 % (ref 0.0–0.2)
Neutrophils Relative %: 71 %
Platelets: 189 10*3/uL (ref 150–400)
RBC: 3.72 MIL/uL — AB (ref 3.87–5.11)
RDW: 13.8 % (ref 11.5–15.5)
WBC: 6.2 10*3/uL (ref 4.0–10.5)

## 2018-07-10 LAB — COMPREHENSIVE METABOLIC PANEL
ALBUMIN: 3.1 g/dL — AB (ref 3.5–5.0)
ALK PHOS: 53 U/L (ref 38–126)
ALT: 20 U/L (ref 0–44)
ANION GAP: 8 (ref 5–15)
AST: 25 U/L (ref 15–41)
BILIRUBIN TOTAL: 0.4 mg/dL (ref 0.3–1.2)
BUN: 29 mg/dL — ABNORMAL HIGH (ref 8–23)
CALCIUM: 8.5 mg/dL — AB (ref 8.9–10.3)
CO2: 25 mmol/L (ref 22–32)
CREATININE: 1.14 mg/dL — AB (ref 0.44–1.00)
Chloride: 105 mmol/L (ref 98–111)
GFR calc Af Amer: 49 mL/min — ABNORMAL LOW (ref >60)
GFR calc non Af Amer: 42 mL/min — ABNORMAL LOW (ref >60)
Glucose, Bld: 112 mg/dL — ABNORMAL HIGH (ref 70–99)
Potassium: 4.1 mmol/L (ref 3.5–5.1)
SODIUM: 138 mmol/L (ref 135–145)
TOTAL PROTEIN: 5.8 g/dL — AB (ref 6.5–8.1)

## 2018-07-10 MED ORDER — FUROSEMIDE 20 MG PO TABS: 20.0000 mg | ORAL_TABLET | Freq: Every day | ORAL | 0 refills | Status: AC

## 2018-07-10 NOTE — ED Notes (Signed)
Patient transported to X-ray 

## 2018-07-10 NOTE — ED Provider Notes (Signed)
[facility] DEPT Provider Note   CSN: [id number] Arrival date & time: 07/10/18  1001     History   Chief Complaint Chief Complaint  Patient presents with  . Leg Swelling    HPI Stephanie [name] is a 84 y.o. female.  The history is provided by the patient and medical records. No language interpreter was used.   Stephanie [name] is a 84 y.o. female  with a PMH as listed below who presents to the Emergency Department complaining of bilateral lower extremity swelling and redness for about 2 weeks. Seen at onset in ER on 12/19 where she was diagnosed with cellulitis and discharged home with rx for keflex and bactrim. Took ABX, but does not feel like there has been any improvement in the redness. She was having swelling during the day when up which would improve with rest, but over the last 2-3 days feels as if the swelling has been persistent. No fevers, chest pain or shortness of breath.   Past Medical History:  Diagnosis Date  . Arthritis   . Blood transfusion    Last hip surgery.  . 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 Hypertension   . 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 Cellulitis of right leg   . Cellulitis 03/15/2017  . Fever 03/15/2017  . Leukocytosis 03/15/2017  . Hyperglycemia 03/15/2017  . Malignant neoplasm of anterior two-thirds of tongue, part unspecified ([place]) 09/29/2011  . Tongue cancer ([place])   . Arthritis   . Cancer ([place]) 09/11/2011  . HTN (hypertension) 08/16/2011    Past Surgical History:  Procedure Laterality Date  . APPENDECTOMY     w/BTL  . CATARACT EXTRACTION    . HEMIGLOSSECTOMY  09/26/2011   Procedure: HEMIGLOSSECTOMY;  Surgeon: Melissa Montane, MD;  Location: [facility];  Service: ENT;  Laterality: Left;  . HEMIGLOSSECTOMY  09/26/11   Left, resection-inv squamous cell carcinoma  . TOTAL HIP ARTHROPLASTY     bialt  . TUBAL LIGATION    .  WISDOM TOOTH EXTRACTION       OB History   No obstetric history on file.      Home Medications    Prior to Admission medications   Medication Sig Start Date End Date Taking? Authorizing Provider  aliskiren (TEKTURNA) 300 MG tablet Take 300 mg by mouth daily.   Yes [provider]  amLODipine (NORVASC) 5 MG tablet Take 5 mg by mouth daily.   Yes [provider]  carvedilol (COREG) 6.25 MG tablet Take 6.25 mg by mouth 2 (two) times daily.    Yes [provider]  cephALEXin (KEFLEX) 500 MG capsule Take 2,000 mg by mouth as directed. Take 4 tablets (2000 mg) prior to dental appointments   Yes [provider]  cephALEXin (KEFLEX) 500 MG capsule Take 1 capsule (500 mg total) by mouth 4 (four) times daily. 07/02/18  Yes Davonna Belling, MD  Cholecalciferol (VITAMIN D) 50 MCG (2000 UT) tablet Take 2,000 Units by mouth daily.   Yes [provider]  HYDROcodone-acetaminophen (NORCO) 5-325 MG per tablet Take 1 tablet by mouth every 4 (four) hours as needed for moderate pain or severe pain.    Yes [provider]  potassium chloride SA (K-DUR,KLOR-CON) 20 MEQ tablet Take 20 mEq by mouth daily.   Yes [provider]  raloxifene (EVISTA) 60 MG tablet Take 60 mg by mouth  daily.   Yes [provider]  ramipril (ALTACE) 10 MG capsule Take 10 mg by mouth daily.   Yes [provider]  vitamin C (ASCORBIC ACID) 500 MG tablet Take 500 mg by mouth daily.   Yes [provider]  furosemide (LASIX) 20 MG tablet Take 1 tablet (20 mg total) by mouth daily. 07/10/18   Johntavius Shepard, Ozella Almond, PA-C    Family History Family History  Problem Relation Age of Onset  . Heart attack Mother   . Cancer Father        leukemia  . Cancer Brother        prostate, brain tumor,lung  . Cancer Daughter        NHL  . Diabetes Maternal Uncle   . Diabetes Maternal Grandfather   . Anesthesia problems Neg Hx     Social History Social  History   Tobacco Use  . Smoking status: Former Smoker    Years: 5.00    Last attempt to quit: 07/15/1956    Years since quitting: 62.0  . Smokeless tobacco: Never Used  . Tobacco comment: does not remember PPD, maybe 5 cigs/day  Substance Use Topics  . Alcohol use: No  . Drug use: No     Allergies   Morphine and related   Review of Systems Review of Systems  Cardiovascular: Positive for leg swelling. Negative for chest pain and palpitations.  Skin: Positive for color change.  All other systems reviewed and are negative.    Physical Exam Updated Vital Signs BP (!) 161/72 (BP Location: Left Arm)   Pulse 78   Temp 98.2 F (36.8 C) (Oral)   Resp 18   SpO2 97%   Physical Exam Vitals signs and nursing note reviewed.  Constitutional:      General: She is not in acute distress.    Appearance: She is well-developed.  HENT:     Head: Normocephalic and atraumatic.  Cardiovascular:     Rate and Rhythm: Normal rate and regular rhythm.     Heart sounds: Normal heart sounds. No murmur.  Pulmonary:     Effort: Pulmonary effort is normal. No respiratory distress.     Breath sounds: Normal breath sounds.  Abdominal:     General: There is no distension.     Palpations: Abdomen is soft.     Tenderness: There is no abdominal tenderness.  Musculoskeletal:     Right lower leg: Edema present.     Left lower leg: Edema present.  Skin:    General: Skin is warm and dry.     Comments: See images below. Warm to the touch.   Neurological:     Mental Status: She is alert and oriented to person, place, and time.            ED Treatments / Results  Labs (all labs ordered are listed, but only abnormal results are displayed) Labs Reviewed  CBC WITH DIFFERENTIAL/PLATELET - Abnormal; Notable for the following components:      Result Value   RBC 3.72 (*)    Hemoglobin 10.6 (*)    HCT 34.3 (*)    All other components within normal limits  COMPREHENSIVE METABOLIC PANEL -  Abnormal; Notable for the following components:   Glucose, Bld 112 (*)    BUN 29 (*)    Creatinine, Ser 1.14 (*)    Calcium 8.5 (*)    Total Protein 5.8 (*)    Albumin 3.1 (*)    GFR calc  non Af Amer 42 (*)    GFR calc Af Amer 49 (*)    All other components within normal limits    EKG None  Radiology Dg Chest 2 View  Result Date: 07/10/2018 CLINICAL DATA:  Bilateral lower leg swelling EXAM: CHEST - 2 VIEW COMPARISON:  03/15/2017 FINDINGS: There is no focal parenchymal opacity. There is no pneumothorax. There is a small right pleural effusion. The heart and mediastinal contours are unremarkable. 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 IMPRESSION: Small right pleural effusion. Electronically Signed   By: Kathreen Devoid   On: 07/10/2018 11:40    Procedures Procedures (including critical care time)  Medications Ordered in ED Medications - No data to display   Initial Impression / Assessment and Plan / ED Course  I have reviewed the triage vital signs and the nursing notes.  Pertinent labs & imaging results that were available during my care of the patient were reviewed by me and considered in my medical decision making (see chart for details).    Stephanie [name] is a 84 y.o. female who presents to ED for persistent bilateral lower extremity swelling and redness.  Seen at onset of symptoms on 12/19 where she was started on antibiotics for possible cellulitis.  No worsening, however does not feel like her symptoms are much better.  No fever.  Normal white count.  Exam today consistent with chronic venous stasis, however stressed reasons to return to emergency department and discussed them at length including fever or worsening erythema. Will give rx for 20mg  lasix for the next 3 days, then have her follow-up with her PCP for recheck. She agrees with plan. All questions answered.   Patient seen by and discussed with Dr. Laverta Baltimore who agrees with treatment plan.     Final Clinical Impressions(s) / ED Diagnoses   Final diagnoses:  Leg swelling    ED Discharge Orders         Ordered    furosemide (LASIX) 20 MG tablet  Daily     07/10/18 1228           Rey Fors, Ozella Almond, PA-C 07/10/18 1303    Margette Fast, MD 07/10/18 [phone]

## 2018-07-10 NOTE — ED Triage Notes (Signed)
Pt was seen here recently for cellulitis on bilat lower legs and finished her antibiotics today. Pt reports for the past 5 days had bilat lower leg swelling.

## 2018-07-10 NOTE — ED Notes (Signed)
D/c paperwork reviewed with pt, no concerns conveyed by pt.  Pt wheeled to front.

## 2018-07-10 NOTE — Discharge Instructions (Signed)
It was my pleasure taking care of you today!   Take Lasix for the next 3 days to help with swelling.   Follow up with your primary care doctor as soon as possible.   Return to ER for fever, chest pain, trouble breathing, new or worsening symptoms, any additional concerns.

## 2018-07-14 DIAGNOSIS — L03116 Cellulitis of left lower limb: Secondary | ICD-10-CM | POA: Diagnosis not present

## 2018-07-17 DIAGNOSIS — M810 Age-related osteoporosis without current pathological fracture: Secondary | ICD-10-CM | POA: Diagnosis not present

## 2018-07-17 DIAGNOSIS — I10 Essential (primary) hypertension: Secondary | ICD-10-CM | POA: Diagnosis not present

## 2018-07-17 DIAGNOSIS — I872 Venous insufficiency (chronic) (peripheral): Secondary | ICD-10-CM | POA: Insufficient documentation

## 2018-07-17 DIAGNOSIS — R7301 Impaired fasting glucose: Secondary | ICD-10-CM | POA: Diagnosis not present

## 2018-07-17 DIAGNOSIS — M150 Primary generalized (osteo)arthritis: Secondary | ICD-10-CM | POA: Diagnosis not present

## 2018-07-17 DIAGNOSIS — E782 Mixed hyperlipidemia: Secondary | ICD-10-CM | POA: Diagnosis not present

## 2018-07-27 DIAGNOSIS — I10 Essential (primary) hypertension: Secondary | ICD-10-CM | POA: Diagnosis not present

## 2018-07-27 DIAGNOSIS — E782 Mixed hyperlipidemia: Secondary | ICD-10-CM | POA: Diagnosis not present

## 2018-07-27 DIAGNOSIS — R7301 Impaired fasting glucose: Secondary | ICD-10-CM | POA: Diagnosis not present

## 2018-08-11 DIAGNOSIS — M810 Age-related osteoporosis without current pathological fracture: Secondary | ICD-10-CM | POA: Diagnosis not present

## 2018-08-11 DIAGNOSIS — I10 Essential (primary) hypertension: Secondary | ICD-10-CM | POA: Diagnosis not present

## 2018-08-11 DIAGNOSIS — R7301 Impaired fasting glucose: Secondary | ICD-10-CM | POA: Diagnosis not present

## 2018-08-11 DIAGNOSIS — M150 Primary generalized (osteo)arthritis: Secondary | ICD-10-CM | POA: Diagnosis not present

## 2018-08-11 DIAGNOSIS — I872 Venous insufficiency (chronic) (peripheral): Secondary | ICD-10-CM | POA: Diagnosis not present

## 2018-08-11 DIAGNOSIS — E782 Mixed hyperlipidemia: Secondary | ICD-10-CM | POA: Diagnosis not present

## 2019-04-06 DIAGNOSIS — I10 Essential (primary) hypertension: Secondary | ICD-10-CM | POA: Diagnosis not present

## 2019-04-06 DIAGNOSIS — R7301 Impaired fasting glucose: Secondary | ICD-10-CM | POA: Diagnosis not present

## 2019-04-13 DIAGNOSIS — I10 Essential (primary) hypertension: Secondary | ICD-10-CM | POA: Diagnosis not present

## 2019-04-13 DIAGNOSIS — I872 Venous insufficiency (chronic) (peripheral): Secondary | ICD-10-CM | POA: Insufficient documentation

## 2019-04-13 DIAGNOSIS — M810 Age-related osteoporosis without current pathological fracture: Secondary | ICD-10-CM | POA: Diagnosis not present

## 2019-04-13 DIAGNOSIS — E782 Mixed hyperlipidemia: Secondary | ICD-10-CM | POA: Diagnosis not present

## 2019-04-13 DIAGNOSIS — Z79891 Long term (current) use of opiate analgesic: Secondary | ICD-10-CM | POA: Diagnosis not present

## 2019-04-13 DIAGNOSIS — R7301 Impaired fasting glucose: Secondary | ICD-10-CM | POA: Diagnosis not present

## 2019-04-13 DIAGNOSIS — M8949 Other hypertrophic osteoarthropathy, multiple sites: Secondary | ICD-10-CM | POA: Diagnosis not present

## 2019-05-11 DIAGNOSIS — Z23 Encounter for immunization: Secondary | ICD-10-CM | POA: Diagnosis not present

## 2019-07-13 ENCOUNTER — Encounter: Payer: Self-pay | Admitting: Podiatry

## 2019-07-13 ENCOUNTER — Ambulatory Visit (INDEPENDENT_AMBULATORY_CARE_PROVIDER_SITE_OTHER): Payer: Medicare Other | Admitting: Podiatry

## 2019-07-13 ENCOUNTER — Other Ambulatory Visit: Payer: Self-pay

## 2019-07-13 VITALS — BP 170/92 | HR 81 | Resp 16

## 2019-07-13 DIAGNOSIS — B351 Tinea unguium: Secondary | ICD-10-CM | POA: Diagnosis not present

## 2019-07-13 DIAGNOSIS — M79676 Pain in unspecified toe(s): Secondary | ICD-10-CM | POA: Diagnosis not present

## 2019-07-13 NOTE — Progress Notes (Signed)
Subjective:  Patient ID: Stephanie [name], female    DOB: 1926/04/21,  MRN: [id number] HPI Chief Complaint  Patient presents with  . Debridement    Hallux nail left - toenails came off few days ago, not sore, requesting other toenails to be trimmed  . New Patient (Initial Visit)    85 y.o. female presents with the above complaint.   ROS: She denies fever chills nausea vomiting muscle aches pains calf pain back pain chest pain shortness of breath.  Past Medical History:  Diagnosis Date  . Arthritis   . Blood transfusion    Last hip surgery.  . 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 Hypertension   . 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 APPENDECTOMY     w/BTL  . CATARACT EXTRACTION    . HEMIGLOSSECTOMY  09/26/2011   Procedure: HEMIGLOSSECTOMY;  Surgeon: Melissa Montane, MD;  Location: [facility];  Service: ENT;  Laterality: Left;  . HEMIGLOSSECTOMY  09/26/11   Left, resection-inv squamous cell carcinoma  . TOTAL HIP ARTHROPLASTY     bialt  . TUBAL LIGATION    . WISDOM TOOTH EXTRACTION      Current Outpatient Medications:  .  aliskiren (TEKTURNA) 300 MG tablet, Take 300 mg by mouth daily., Disp: , Rfl:  .  amLODipine (NORVASC) 5 MG tablet, Take 5 mg by mouth daily., Disp: , Rfl:  .  carvedilol (COREG) 6.25 MG tablet, Take 6.25 mg by mouth 2 (two) times daily. , Disp: , Rfl:  .  Cholecalciferol (VITAMIN D) 50 MCG (2000 UT) tablet, Take 2,000 Units by mouth daily., Disp: , Rfl:  .  furosemide (LASIX) 20 MG tablet, Take 1 tablet (20 mg total) by mouth daily., Disp: 3 tablet, Rfl: 0 .  HYDROcodone-acetaminophen (NORCO) 5-325 MG per tablet, Take 1 tablet by mouth every 4 (four) hours as needed for moderate pain or severe pain. , Disp: , Rfl:  .  potassium chloride SA (K-DUR,KLOR-CON) 20 MEQ tablet, Take 20 mEq by mouth daily., Disp: , Rfl:  .  raloxifene (EVISTA) 60 MG tablet, Take 60 mg by mouth daily., Disp: ,  Rfl:  .  ramipril (ALTACE) 10 MG capsule, Take 10 mg by mouth daily., Disp: , Rfl:  .  vitamin C (ASCORBIC ACID) 500 MG tablet, Take 500 mg by mouth daily., Disp: , Rfl:   Allergies  Allergen Reactions  . Morphine And Related Other (See Comments)    Pt states she has never taken but will not take due to husband's severe reaction to Morphine.   Review of Systems Objective:   Vitals:   07/13/19 1446  BP: (!) 170/92  Pulse: 81  Resp: 16    General: Well developed, nourished, in no acute distress, alert and oriented x3   Dermatological: Skin is warm, dry and supple bilateral. Nails x 10 are well maintained; remaining integument appears unremarkable at this time. There are no open sores, no preulcerative lesions, no rash or signs of infection present.  Toenails are thick yellow dystrophic-like mycotic 1 through 5 right 2 through 5 left she has lost the hallux nail left there is no bleeding no purulence no malodor no signs of infection.  Vascular: Dorsalis Pedis artery and Posterior Tibial artery pedal pulses are 2/4 bilateral with immedate capillary fill time. Pedal hair growth present. No varicosities and no lower extremity edema present bilateral.   Neruologic: Grossly intact via light touch  bilateral. Vibratory intact via tuning fork bilateral. Protective threshold with Semmes Wienstein monofilament intact to all pedal sites bilateral. Patellar and Achilles deep tendon reflexes 2+ bilateral. No Babinski or clonus noted bilateral.   Musculoskeletal: No gross boney pedal deformities bilateral. No pain, crepitus, or limitation noted with foot and ankle range of motion bilateral. Muscular strength 5/5 in all groups tested bilateral.  Gait: Unassisted, Nonantalgic.    Radiographs:  None taken  Assessment & Plan:   Assessment: Painful elongated toenails 1 through 5 right 2 through 5 left.  Plan: Debrided toenails 1 through 5 bilateral.     Becky Berberian T. [place], [place]

## 2019-07-22 DIAGNOSIS — E538 Deficiency of other specified B group vitamins: Secondary | ICD-10-CM | POA: Diagnosis not present

## 2019-07-22 DIAGNOSIS — I10 Essential (primary) hypertension: Secondary | ICD-10-CM | POA: Diagnosis not present

## 2019-07-22 DIAGNOSIS — M810 Age-related osteoporosis without current pathological fracture: Secondary | ICD-10-CM | POA: Diagnosis not present

## 2019-07-22 DIAGNOSIS — R7301 Impaired fasting glucose: Secondary | ICD-10-CM | POA: Diagnosis not present

## 2019-07-22 DIAGNOSIS — E782 Mixed hyperlipidemia: Secondary | ICD-10-CM | POA: Diagnosis not present

## 2019-07-22 DIAGNOSIS — M8949 Other hypertrophic osteoarthropathy, multiple sites: Secondary | ICD-10-CM | POA: Diagnosis not present

## 2019-07-22 DIAGNOSIS — I872 Venous insufficiency (chronic) (peripheral): Secondary | ICD-10-CM | POA: Diagnosis not present

## 2019-08-05 ENCOUNTER — Ambulatory Visit

## 2019-10-12 ENCOUNTER — Ambulatory Visit (INDEPENDENT_AMBULATORY_CARE_PROVIDER_SITE_OTHER): Payer: Medicare Other | Admitting: Podiatry

## 2019-10-12 ENCOUNTER — Other Ambulatory Visit: Payer: Medicare Other | Admitting: Orthotics

## 2019-10-12 ENCOUNTER — Encounter: Payer: Self-pay | Admitting: Podiatry

## 2019-10-12 ENCOUNTER — Other Ambulatory Visit: Payer: Self-pay

## 2019-10-12 DIAGNOSIS — B351 Tinea unguium: Secondary | ICD-10-CM

## 2019-10-12 DIAGNOSIS — M79676 Pain in unspecified toe(s): Secondary | ICD-10-CM

## 2019-10-12 NOTE — Patient Instructions (Addendum)
Skecher's sandals or sneakers   Onychomycosis/Fungal Toenails  WHAT IS IT? An infection that lies within the keratin of your nail plate that is caused by a fungus.  WHY ME? Fungal infections affect all ages, sexes, races, and creeds.  There may be many factors that predispose you to a fungal infection such as age, coexisting medical conditions such as diabetes, or an autoimmune disease; stress, medications, fatigue, genetics, etc.  Bottom line: fungus thrives in a warm, moist environment and your shoes offer such a location.  IS IT CONTAGIOUS? Theoretically, yes.  You do not want to share shoes, nail clippers or files with someone who has fungal toenails.  Walking around barefoot in the same room or sleeping in the same bed is unlikely to transfer the organism.  It is important to realize, however, that fungus can spread easily from one nail to the next on the same foot.  HOW DO WE TREAT THIS?  There are several ways to treat this condition.  Treatment may depend on many factors such as age, medications, pregnancy, liver and kidney conditions, etc.  It is best to ask your doctor which options are available to you.  1. No treatment.   Unlike many other medical concerns, you can live with this condition.  However for many people this can be a painful condition and may lead to ingrown toenails or a bacterial infection.  It is recommended that you keep the nails cut short to help reduce the amount of fungal nail. 2. Topical treatment.  These range from herbal remedies to prescription strength nail lacquers.  About 40-50% effective, topicals require twice daily application for approximately 9 to 12 months or until an entirely new nail has grown out.  The most effective topicals are medical grade medications available through physicians offices. 3. Oral antifungal medications.  With an 80-90% cure rate, the most common oral medication requires 3 to 4 months of therapy and stays in your system for a year as  the new nail grows out.  Oral antifungal medications do require blood work to make sure it is a safe drug for you.  A liver function panel will be performed prior to starting the medication and after the first month of treatment.  It is important to have the blood work performed to avoid any harmful side effects.  In general, this medication safe but blood work is required. 4. Laser Therapy.  This treatment is performed by applying a specialized laser to the affected nail plate.  This therapy is noninvasive, fast, and non-painful.  It is not covered by insurance and is therefore, out of pocket.  The results have been very good with a 80-95% cure rate.  The [facility] is the only practice in the area to offer this therapy. 5. Permanent Nail Avulsion.  Removing the entire nail so that a new nail will not grow back.

## 2019-10-12 NOTE — Progress Notes (Signed)
Subjective: Stephanie [name] presents today for follow up of painful mycotic nails b/l that are difficult to trim. Pain interferes with ambulation. Aggravating factors include wearing enclosed shoe gear. Pain is relieved with periodic professional debridement.   Allergies  Allergen Reactions  . Morphine And Related Other (See Comments)    Pt states she has never taken but will not take due to husband's severe reaction to Morphine.    Objective: There were no vitals filed for this visit.  Pt 86 y.o. year old Caucasian female WD, WN in NAD. AAO x 3.   Vascular Examination:  Capillary refill time to digits immediate b/l. Palpable DP pulses b/l. Palpable PT pulses b/l. Pedal hair present b/l. Skin temperature gradient within normal limits b/l. No edema noted b/l.  Dermatological Examination: Pedal skin with normal turgor, texture and tone bilaterally. No open wounds bilaterally. No interdigital macerations bilaterally. Toenails 1-5 b/l elongated, dystrophic, thickened, crumbly with subungual debris and tenderness to dorsal palpation.  Musculoskeletal: Normal muscle strength 5/5 to all lower extremity muscle groups bilaterally, no gross bony deformities bilaterally and no pain crepitus or joint limitation noted with ROM b/l  Neurological: Protective sensation intact 5/5 intact bilaterally with 10g monofilament b/l Vibratory sensation intact b/l Proprioception intact bilaterally  Assessment: 1. Pain due to onychomycosis of toenail    Plan: -Toenails 1-5 b/l were debrided in length and girth with sterile nail nippers and dremel without iatrogenic bleeding.  -Patient to continue soft, supportive shoe gear daily. -Patient to report any pedal injuries to medical professional immediately. -Patient/POA to call should there be question/concern in the interim.  Return in about 3 months (around 01/12/2020) for nail trim.

## 2019-10-20 DIAGNOSIS — E538 Deficiency of other specified B group vitamins: Secondary | ICD-10-CM | POA: Diagnosis not present

## 2019-10-20 DIAGNOSIS — E782 Mixed hyperlipidemia: Secondary | ICD-10-CM | POA: Diagnosis not present

## 2019-10-20 DIAGNOSIS — I10 Essential (primary) hypertension: Secondary | ICD-10-CM | POA: Diagnosis not present

## 2019-10-20 DIAGNOSIS — R7301 Impaired fasting glucose: Secondary | ICD-10-CM | POA: Diagnosis not present

## 2019-10-22 DIAGNOSIS — M1991 Primary osteoarthritis, unspecified site: Secondary | ICD-10-CM | POA: Insufficient documentation

## 2019-10-22 DIAGNOSIS — R7301 Impaired fasting glucose: Secondary | ICD-10-CM | POA: Diagnosis not present

## 2019-10-22 DIAGNOSIS — M8949 Other hypertrophic osteoarthropathy, multiple sites: Secondary | ICD-10-CM | POA: Diagnosis not present

## 2019-10-22 DIAGNOSIS — M810 Age-related osteoporosis without current pathological fracture: Secondary | ICD-10-CM | POA: Diagnosis not present

## 2019-10-22 DIAGNOSIS — L03115 Cellulitis of right lower limb: Secondary | ICD-10-CM | POA: Diagnosis not present

## 2019-10-22 DIAGNOSIS — I872 Venous insufficiency (chronic) (peripheral): Secondary | ICD-10-CM | POA: Diagnosis not present

## 2019-10-22 DIAGNOSIS — E782 Mixed hyperlipidemia: Secondary | ICD-10-CM | POA: Diagnosis not present

## 2019-10-22 DIAGNOSIS — I10 Essential (primary) hypertension: Secondary | ICD-10-CM | POA: Diagnosis not present

## 2019-10-22 DIAGNOSIS — E538 Deficiency of other specified B group vitamins: Secondary | ICD-10-CM | POA: Diagnosis not present

## 2020-01-12 ENCOUNTER — Other Ambulatory Visit: Payer: Self-pay

## 2020-01-12 ENCOUNTER — Ambulatory Visit (INDEPENDENT_AMBULATORY_CARE_PROVIDER_SITE_OTHER): Payer: Medicare Other | Admitting: Podiatry

## 2020-01-12 ENCOUNTER — Encounter: Payer: Self-pay | Admitting: Podiatry

## 2020-01-12 DIAGNOSIS — M79676 Pain in unspecified toe(s): Secondary | ICD-10-CM | POA: Diagnosis not present

## 2020-01-12 DIAGNOSIS — B351 Tinea unguium: Secondary | ICD-10-CM | POA: Diagnosis not present

## 2020-01-16 NOTE — Progress Notes (Signed)
Subjective:  Patient ID: Stephanie [name], female    DOB: 07-30-1925,  MRN: [id number]  86 y.o. female presents with painful thick toenails that are difficult to trim. Pain interferes with ambulation. Aggravating factors include wearing enclosed shoe gear. Pain is relieved with periodic professional debridement..    Review of Systems: Negative except as noted in the HPI. Denies N/V/F/Ch. Past Medical History:  Diagnosis Date   Arthritis    Blood transfusion    Last hip surgery.   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   Past Surgical History:  Procedure Laterality Date   APPENDECTOMY     w/BTL   CATARACT EXTRACTION     HEMIGLOSSECTOMY  09/26/2011   Procedure: HEMIGLOSSECTOMY;  Surgeon: Melissa Montane, MD;  Location: [facility];  Service: ENT;  Laterality: Left;   HEMIGLOSSECTOMY  09/26/11   Left, resection-inv squamous cell carcinoma   TOTAL HIP ARTHROPLASTY     bialt   TUBAL LIGATION     WISDOM TOOTH EXTRACTION     Patient Active Problem List   Diagnosis Date Noted   Chronic venous stasis dermatitis of both lower extremities 07/17/2018   Edema of lower extremity due to peripheral venous insufficiency 07/17/2018   Cellulitis of right leg    Cellulitis 03/15/2017   Fever 03/15/2017   Leukocytosis 03/15/2017   Hyperglycemia 03/15/2017   Hypertension, essential, benign 07/28/2016   Impaired fasting blood sugar 07/28/2016   Mixed hyperlipidemia 07/28/2016   Osteoporosis, postmenopausal 07/28/2016   Primary osteoarthritis involving multiple joints 07/28/2016   Malignant neoplasm of anterior two-thirds of tongue, part unspecified ([place]) 09/29/2011   Tongue cancer ([place])    Arthritis    Cancer ([place]) 09/11/2011   HTN (hypertension) 08/16/2011    Current Outpatient Medications:    augmented betamethasone dipropionate (DIPROLENE-AF) 0.05 % ointment, See admin instructions., Disp: ,  Rfl:    aliskiren (TEKTURNA) 300 MG tablet, Take 300 mg by mouth daily., Disp: , Rfl:    amLODipine (NORVASC) 5 MG tablet, Take 5 mg by mouth daily., Disp: , Rfl:    carvedilol (COREG) 6.25 MG tablet, Take 6.25 mg by mouth 2 (two) times daily. , Disp: , Rfl:    Cholecalciferol (VITAMIN D) 50 MCG (2000 UT) tablet, Take 2,000 Units by mouth daily., Disp: , Rfl:    cyanocobalamin 1000 MCG tablet, Take 1 tablet by mouth daily., Disp: , Rfl:    furosemide (LASIX) 20 MG tablet, Take 1 tablet (20 mg total) by mouth daily., Disp: 3 tablet, Rfl: 0   HYDROcodone-acetaminophen (NORCO) 5-325 MG per tablet, Take 1 tablet by mouth every 4 (four) hours as needed for moderate pain or severe pain. , Disp: , Rfl:    potassium chloride SA (K-DUR,KLOR-CON) 20 MEQ tablet, Take 20 mEq by mouth daily., Disp: , Rfl:    raloxifene (EVISTA) 60 MG tablet, Take 60 mg by mouth daily., Disp: , Rfl:    ramipril (ALTACE) 10 MG capsule, Take 10 mg by mouth daily., Disp: , Rfl:    vitamin C (ASCORBIC ACID) 500 MG tablet, Take 500 mg by mouth daily., Disp: , Rfl:  Allergies  Allergen Reactions   Morphine And Related Other (See Comments)    Pt states she has never taken but will not take due to husband's severe reaction to Morphine.   Social History   Tobacco Use  Smoking Status Former Smoker   Years: 5.00   Quit date:  07/15/1956   Years since quitting: 63.5  Smokeless Tobacco Never Used  Tobacco Comment   does not remember PPD, maybe 5 cigs/day   Objective:  There were no vitals filed for this visit. Constitutional Patient is a pleasant 86 y.o. Caucasian female, in NAD.Marland Kitchen  Vascular Capillary refill time to digits immediate b/l. Palpable pedal pulses b/l LE. Pedal hair present. Lower extremity skin temperature gradient within normal limits. No pain with calf compression b/l. No edema noted b/l lower extremities..  No cyanosis or clubbing noted.  Neurologic Normal speech. Oriented to person, place, and  time. Protective sensation intact 5/5 intact bilaterally with 10g monofilament b/l. Vibratory sensation intact b/l. Proprioception intact bilaterally.  Dermatologic Pedal skin with normal turgor, texture and tone bilaterally. No open wounds bilaterally. No interdigital macerations bilaterally. Toenails 1-5 b/l elongated, discolored, dystrophic, thickened, crumbly with subungual debris and tenderness to dorsal palpation.  Orthopedic: Normal muscle strength 5/5 to all lower extremity muscle groups bilaterally. No pain crepitus or joint limitation noted with ROM b/l. No gross bony deformities bilaterally.   Radiographs: None Assessment:   1. Pain due to onychomycosis of toenail    Plan:  Patient was evaluated and treated and all questions answered.  Onychomycosis with pain -Nails palliatively debridement as below. -Educated on self-care  Procedure: Nail Debridement Rationale: Pain Type of Debridement: manual, sharp debridement. Instrumentation: Nail nipper, rotary burr. Number of Nails: 10  -Examined patient. -No new findings. No new orders. -Toenails 1-5 b/l were debrided in length and girth with sterile nail nippers and dremel without iatrogenic bleeding.  -Patient to continue soft, supportive shoe gear daily. Start procedure for diabetic shoes. Patient qualifies based on diagnoses. -Patient to continue soft, supportive shoe gear daily. -Patient/POA to call should there be question/concern in the interim.  Return in about 3 months (around 04/13/2020) for nail and callus trim.  Marzetta Board, DPM

## 2020-01-18 DIAGNOSIS — H52223 Regular astigmatism, bilateral: Secondary | ICD-10-CM | POA: Diagnosis not present

## 2020-01-18 DIAGNOSIS — H5213 Myopia, bilateral: Secondary | ICD-10-CM | POA: Diagnosis not present

## 2020-01-18 DIAGNOSIS — H524 Presbyopia: Secondary | ICD-10-CM | POA: Diagnosis not present

## 2020-01-18 DIAGNOSIS — H259 Unspecified age-related cataract: Secondary | ICD-10-CM | POA: Diagnosis not present

## 2020-01-20 DIAGNOSIS — I10 Essential (primary) hypertension: Secondary | ICD-10-CM | POA: Diagnosis not present

## 2020-01-20 DIAGNOSIS — M810 Age-related osteoporosis without current pathological fracture: Secondary | ICD-10-CM | POA: Diagnosis not present

## 2020-01-20 DIAGNOSIS — E538 Deficiency of other specified B group vitamins: Secondary | ICD-10-CM | POA: Diagnosis not present

## 2020-01-20 DIAGNOSIS — R7301 Impaired fasting glucose: Secondary | ICD-10-CM | POA: Diagnosis not present

## 2020-01-21 DIAGNOSIS — E538 Deficiency of other specified B group vitamins: Secondary | ICD-10-CM | POA: Diagnosis not present

## 2020-01-21 DIAGNOSIS — M810 Age-related osteoporosis without current pathological fracture: Secondary | ICD-10-CM | POA: Diagnosis not present

## 2020-01-21 DIAGNOSIS — Z79891 Long term (current) use of opiate analgesic: Secondary | ICD-10-CM | POA: Diagnosis not present

## 2020-01-21 DIAGNOSIS — E876 Hypokalemia: Secondary | ICD-10-CM | POA: Diagnosis not present

## 2020-01-21 DIAGNOSIS — I10 Essential (primary) hypertension: Secondary | ICD-10-CM | POA: Diagnosis not present

## 2020-01-21 DIAGNOSIS — R7301 Impaired fasting glucose: Secondary | ICD-10-CM | POA: Diagnosis not present

## 2020-01-21 DIAGNOSIS — Z96643 Presence of artificial hip joint, bilateral: Secondary | ICD-10-CM | POA: Diagnosis not present

## 2020-01-21 DIAGNOSIS — E782 Mixed hyperlipidemia: Secondary | ICD-10-CM | POA: Diagnosis not present

## 2020-01-21 DIAGNOSIS — M159 Polyosteoarthritis, unspecified: Secondary | ICD-10-CM | POA: Diagnosis not present

## 2020-01-25 DIAGNOSIS — H25013 Cortical age-related cataract, bilateral: Secondary | ICD-10-CM | POA: Diagnosis not present

## 2020-01-25 DIAGNOSIS — H2511 Age-related nuclear cataract, right eye: Secondary | ICD-10-CM | POA: Diagnosis not present

## 2020-01-25 DIAGNOSIS — H25043 Posterior subcapsular polar age-related cataract, bilateral: Secondary | ICD-10-CM | POA: Diagnosis not present

## 2020-01-25 DIAGNOSIS — H2513 Age-related nuclear cataract, bilateral: Secondary | ICD-10-CM | POA: Diagnosis not present

## 2020-01-25 DIAGNOSIS — H18413 Arcus senilis, bilateral: Secondary | ICD-10-CM | POA: Diagnosis not present

## 2020-01-25 DIAGNOSIS — H353131 Nonexudative age-related macular degeneration, bilateral, early dry stage: Secondary | ICD-10-CM | POA: Diagnosis not present

## 2020-03-06 DIAGNOSIS — H2511 Age-related nuclear cataract, right eye: Secondary | ICD-10-CM | POA: Diagnosis not present

## 2020-03-07 DIAGNOSIS — H2512 Age-related nuclear cataract, left eye: Secondary | ICD-10-CM | POA: Diagnosis not present

## 2020-03-27 DIAGNOSIS — H2512 Age-related nuclear cataract, left eye: Secondary | ICD-10-CM | POA: Diagnosis not present

## 2020-04-25 ENCOUNTER — Other Ambulatory Visit: Payer: Self-pay

## 2020-04-25 ENCOUNTER — Encounter: Payer: Self-pay | Admitting: Podiatry

## 2020-04-25 ENCOUNTER — Ambulatory Visit (INDEPENDENT_AMBULATORY_CARE_PROVIDER_SITE_OTHER): Payer: Medicare Other | Admitting: Podiatry

## 2020-04-25 DIAGNOSIS — R7301 Impaired fasting glucose: Secondary | ICD-10-CM | POA: Diagnosis not present

## 2020-04-25 DIAGNOSIS — I10 Essential (primary) hypertension: Secondary | ICD-10-CM | POA: Diagnosis not present

## 2020-04-25 DIAGNOSIS — M79676 Pain in unspecified toe(s): Secondary | ICD-10-CM

## 2020-04-25 DIAGNOSIS — B351 Tinea unguium: Secondary | ICD-10-CM | POA: Diagnosis not present

## 2020-04-25 DIAGNOSIS — E876 Hypokalemia: Secondary | ICD-10-CM | POA: Diagnosis not present

## 2020-04-27 DIAGNOSIS — E782 Mixed hyperlipidemia: Secondary | ICD-10-CM | POA: Diagnosis not present

## 2020-04-27 DIAGNOSIS — I10 Essential (primary) hypertension: Secondary | ICD-10-CM | POA: Diagnosis not present

## 2020-04-27 DIAGNOSIS — E538 Deficiency of other specified B group vitamins: Secondary | ICD-10-CM | POA: Diagnosis not present

## 2020-04-27 DIAGNOSIS — E876 Hypokalemia: Secondary | ICD-10-CM | POA: Diagnosis not present

## 2020-04-27 DIAGNOSIS — R7301 Impaired fasting glucose: Secondary | ICD-10-CM | POA: Diagnosis not present

## 2020-04-27 DIAGNOSIS — I872 Venous insufficiency (chronic) (peripheral): Secondary | ICD-10-CM | POA: Diagnosis not present

## 2020-04-27 DIAGNOSIS — M810 Age-related osteoporosis without current pathological fracture: Secondary | ICD-10-CM | POA: Diagnosis not present

## 2020-04-27 DIAGNOSIS — L03115 Cellulitis of right lower limb: Secondary | ICD-10-CM | POA: Diagnosis not present

## 2020-04-27 DIAGNOSIS — M8949 Other hypertrophic osteoarthropathy, multiple sites: Secondary | ICD-10-CM | POA: Diagnosis not present

## 2020-04-29 NOTE — Progress Notes (Signed)
Subjective:  Patient ID: Stephanie [name], female    DOB: June 30, 1926,  MRN: [id number]  86 y.o. female presents with painful thick toenails that are difficult to trim. Pain interferes with ambulation. Aggravating factors include wearing enclosed shoe gear. Pain is relieved with periodic professional debridement.   She voices no new pedal problems on today's visit.  Review of Systems: Negative except as noted in the HPI. Denies N/V/F/Ch. Past Medical History:  Diagnosis Date  . Arthritis   . Blood transfusion    Last hip surgery.  . Cancer ([place]) 09/11/11   tongue-squamous cell  . Hypertension   . 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 APPENDECTOMY     w/BTL  . CATARACT EXTRACTION    . HEMIGLOSSECTOMY  09/26/2011   Procedure: HEMIGLOSSECTOMY;  Surgeon: Melissa Montane, MD;  Location: [facility];  Service: ENT;  Laterality: Left;  . HEMIGLOSSECTOMY  09/26/11   Left, resection-inv squamous cell carcinoma  . TOTAL HIP ARTHROPLASTY     bialt  . TUBAL LIGATION    . WISDOM TOOTH EXTRACTION     Patient Active Problem List   Diagnosis Date Noted  . Hypokalemia 04/25/2020  . Vitamin B12 deficiency 07/22/2019  . Chronic venous stasis dermatitis of both lower extremities 07/17/2018  . Edema of lower extremity due to peripheral venous insufficiency 07/17/2018  . Cellulitis of right leg   . Cellulitis 03/15/2017  . Fever 03/15/2017  . Leukocytosis 03/15/2017  . Hyperglycemia 03/15/2017  . Hypertension, essential, benign 07/28/2016  . Impaired fasting blood sugar 07/28/2016  . Mixed hyperlipidemia 07/28/2016  . Osteoporosis, postmenopausal 07/28/2016  . Primary osteoarthritis involving multiple joints 07/28/2016  . Malignant neoplasm of anterior two-thirds of tongue, part unspecified ([place]) 09/29/2011  . Tongue cancer ([place])   . Arthritis   . Cancer ([place]) 09/11/2011  . HTN (hypertension) 08/16/2011    Current Outpatient  Medications:  .  aliskiren (TEKTURNA) 300 MG tablet, Take 300 mg by mouth daily., Disp: , Rfl:  .  amLODipine (NORVASC) 5 MG tablet, Take 5 mg by mouth daily., Disp: , Rfl:  .  augmented betamethasone dipropionate (DIPROLENE-AF) 0.05 % ointment, See admin instructions., Disp: , Rfl:  .  BESIVANCE 0.6 % SUSP, , Disp: , Rfl:  .  Bromfenac Sodium (PROLENSA) 0.07 % SOLN, INSTILL 1 DROP INTO RIGHT EYE EVERY EVENING AS DIRECTED, Disp: , Rfl:  .  carvedilol (COREG) 6.25 MG tablet, Take 6.25 mg by mouth 2 (two) times daily. , Disp: , Rfl:  .  Cholecalciferol (VITAMIN D) 50 MCG (2000 UT) tablet, Take 2,000 Units by mouth daily., Disp: , Rfl:  .  cyanocobalamin 1000 MCG tablet, Take 1 tablet by mouth daily., Disp: , Rfl:  .  Difluprednate (DUREZOL) 0.05 % EMUL, APPLY 1 DROP INTO RIGHT EYE THREE TIMES A DAY AS DIRECTED, Disp: , Rfl:  .  furosemide (LASIX) 20 MG tablet, Take 1 tablet (20 mg total) by mouth daily., Disp: 3 tablet, Rfl: 0 .  HYDROcodone-acetaminophen (NORCO) 5-325 MG per tablet, Take 1 tablet by mouth every 4 (four) hours as needed for moderate pain or severe pain. , Disp: , Rfl:  .  HYDROcodone-acetaminophen (NORCO/VICODIN) 5-325 MG tablet, Take 1 tablet by mouth every 6 (six) hours as needed., Disp: , Rfl:  .  potassium chloride SA (K-DUR,KLOR-CON) 20 MEQ tablet, Take 20 mEq by mouth daily., Disp: , Rfl:  .  raloxifene (EVISTA) 60 MG tablet, Take 60  mg by mouth daily., Disp: , Rfl:  .  ramipril (ALTACE) 10 MG capsule, Take 10 mg by mouth daily., Disp: , Rfl:  .  vitamin C (ASCORBIC ACID) 500 MG tablet, Take 500 mg by mouth daily., Disp: , Rfl:  Allergies  Allergen Reactions  . Morphine And Related Other (See Comments)    Pt states she has never taken but will not take due to husband's severe reaction to Morphine.   Social History   Tobacco Use  Smoking Status Former Smoker  . Years: 5.00  . Quit date: 07/15/1956  . Years since quitting: 63.8  Smokeless Tobacco Never Used  Tobacco  Comment   does not remember PPD, maybe 5 cigs/day   Objective:  There were no vitals filed for this visit. Constitutional Patient is a pleasant 86 y.o. Caucasian female, in NAD.Marland Kitchen  Vascular Capillary refill time to digits immediate b/l. Palpable pedal pulses b/l LE. Pedal hair present. Lower extremity skin temperature gradient within normal limits. No pain with calf compression b/l. No edema noted b/l lower extremities.. No cyanosis or clubbing noted.  Neurologic Normal speech. Protective sensation intact 5/5 intact bilaterally with 10g monofilament b/l. Vibratory sensation intact b/l. Proprioception intact bilaterally.  Dermatologic Pedal skin with normal turgor, texture and tone bilaterally. No open wounds bilaterally. No interdigital macerations bilaterally. Toenails 1-5 b/l elongated, discolored, dystrophic, thickened, crumbly with subungual debris and tenderness to dorsal palpation.  Orthopedic: Normal muscle strength 5/5 to all lower extremity muscle groups bilaterally. No pain crepitus or joint limitation noted with ROM b/l. No gross bony deformities bilaterally.   Radiographs: None Assessment:   1. Pain due to onychomycosis of toenail    Plan:  Patient was evaluated and treated and all questions answered.  Onychomycosis with pain -Nails palliatively debridement as below. -Educated on self-care  Procedure: Nail Debridement Rationale: Pain Type of Debridement: manual, sharp debridement. Instrumentation: Nail nipper, rotary burr. Number of Nails: 10  -Examined patient. -No new findings. No new orders. -Toenails 1-5 b/l were debrided in length and girth with sterile nail nippers and dremel without iatrogenic bleeding.  -Patient to continue soft, supportive shoe gear daily. Start procedure for diabetic shoes. Patient qualifies based on diagnoses. -Patient to continue soft, supportive shoe gear daily. -Patient/POA to call should there be question/concern in the interim.  Return  in about 3 months (around 07/26/2020).  Marzetta Board, DPM

## 2020-06-15 DIAGNOSIS — Z23 Encounter for immunization: Secondary | ICD-10-CM | POA: Diagnosis not present

## 2020-08-01 ENCOUNTER — Ambulatory Visit: Payer: Medicare Other | Admitting: Podiatry

## 2020-08-09 DIAGNOSIS — E876 Hypokalemia: Secondary | ICD-10-CM | POA: Diagnosis not present

## 2020-08-09 DIAGNOSIS — R7301 Impaired fasting glucose: Secondary | ICD-10-CM | POA: Diagnosis not present

## 2020-08-09 DIAGNOSIS — M8949 Other hypertrophic osteoarthropathy, multiple sites: Secondary | ICD-10-CM | POA: Diagnosis not present

## 2020-08-09 DIAGNOSIS — E782 Mixed hyperlipidemia: Secondary | ICD-10-CM | POA: Diagnosis not present

## 2020-08-09 DIAGNOSIS — I10 Essential (primary) hypertension: Secondary | ICD-10-CM | POA: Diagnosis not present

## 2020-08-09 DIAGNOSIS — M159 Polyosteoarthritis, unspecified: Secondary | ICD-10-CM | POA: Diagnosis not present

## 2020-08-09 DIAGNOSIS — L03115 Cellulitis of right lower limb: Secondary | ICD-10-CM | POA: Diagnosis not present

## 2020-08-09 DIAGNOSIS — E538 Deficiency of other specified B group vitamins: Secondary | ICD-10-CM | POA: Diagnosis not present

## 2020-08-09 DIAGNOSIS — M810 Age-related osteoporosis without current pathological fracture: Secondary | ICD-10-CM | POA: Diagnosis not present

## 2020-08-09 DIAGNOSIS — I872 Venous insufficiency (chronic) (peripheral): Secondary | ICD-10-CM | POA: Diagnosis not present

## 2020-08-30 ENCOUNTER — Encounter: Payer: Self-pay | Admitting: Podiatry

## 2020-08-30 ENCOUNTER — Other Ambulatory Visit: Payer: Self-pay

## 2020-08-30 ENCOUNTER — Ambulatory Visit (INDEPENDENT_AMBULATORY_CARE_PROVIDER_SITE_OTHER): Payer: Medicare Other | Admitting: Podiatry

## 2020-08-30 DIAGNOSIS — M79676 Pain in unspecified toe(s): Secondary | ICD-10-CM

## 2020-08-30 DIAGNOSIS — I878 Other specified disorders of veins: Secondary | ICD-10-CM | POA: Diagnosis not present

## 2020-08-30 DIAGNOSIS — B351 Tinea unguium: Secondary | ICD-10-CM | POA: Diagnosis not present

## 2020-08-30 NOTE — Progress Notes (Signed)
This patient returns to my office for at risk foot care.  This patient requires this care by a professional since this patient will be at risk due to having edema due to chronic venous insufficiency.  This patient is unable to cut nails herself since the patient cannot reach her nails.These nails are painful walking and wearing shoes.  She requests no treatment on the corn on the third toe right foot.  This patient presents for at risk foot care today.  General Appearance  Alert, conversant and in no acute stress.  Vascular  Dorsalis pedis and posterior tibial  pulses are palpable  bilaterally.  Capillary return is within normal limits  bilaterally. Temperature is within normal limits  bilaterally.  Neurologic  Senn-Weinstein monofilament wire test within normal limits  bilaterally. Muscle power within normal limits bilaterally.  Nails Thick disfigured discolored nails with subungual debris  from hallux to fifth toes bilaterally. No evidence of bacterial infection or drainage bilaterally.  Orthopedic  No limitations of motion  feet .  No crepitus or effusions noted.  No bony pathology or digital deformities noted.  Skin  normotropic skin with no porokeratosis noted bilaterally.  No signs of infections or ulcers noted.     Onychomycosis  Pain in right toes  Pain in left toes  Consent was obtained for treatment procedures.   Mechanical debridement of nails 1-5  bilaterally performed with a nail nipper.  Filed with dremel without incident.    Return office visit   3 months                  Told patient to return for periodic foot care and evaluation due to potential at risk complications.   Gardiner Barefoot DPM

## 2020-11-13 DIAGNOSIS — E782 Mixed hyperlipidemia: Secondary | ICD-10-CM | POA: Diagnosis not present

## 2020-11-13 DIAGNOSIS — E876 Hypokalemia: Secondary | ICD-10-CM | POA: Diagnosis not present

## 2020-11-13 DIAGNOSIS — R7301 Impaired fasting glucose: Secondary | ICD-10-CM | POA: Diagnosis not present

## 2020-11-13 DIAGNOSIS — I872 Venous insufficiency (chronic) (peripheral): Secondary | ICD-10-CM | POA: Diagnosis not present

## 2020-11-13 DIAGNOSIS — M810 Age-related osteoporosis without current pathological fracture: Secondary | ICD-10-CM | POA: Diagnosis not present

## 2020-11-13 DIAGNOSIS — I10 Essential (primary) hypertension: Secondary | ICD-10-CM | POA: Diagnosis not present

## 2020-11-14 DIAGNOSIS — Z79891 Long term (current) use of opiate analgesic: Secondary | ICD-10-CM | POA: Insufficient documentation

## 2020-11-15 DIAGNOSIS — R7301 Impaired fasting glucose: Secondary | ICD-10-CM | POA: Diagnosis not present

## 2020-11-15 DIAGNOSIS — E538 Deficiency of other specified B group vitamins: Secondary | ICD-10-CM | POA: Diagnosis not present

## 2020-11-15 DIAGNOSIS — I10 Essential (primary) hypertension: Secondary | ICD-10-CM | POA: Diagnosis not present

## 2020-11-15 DIAGNOSIS — E876 Hypokalemia: Secondary | ICD-10-CM | POA: Diagnosis not present

## 2020-11-15 DIAGNOSIS — M159 Polyosteoarthritis, unspecified: Secondary | ICD-10-CM | POA: Diagnosis not present

## 2020-11-15 DIAGNOSIS — E782 Mixed hyperlipidemia: Secondary | ICD-10-CM | POA: Diagnosis not present

## 2020-11-15 DIAGNOSIS — I872 Venous insufficiency (chronic) (peripheral): Secondary | ICD-10-CM | POA: Diagnosis not present

## 2020-11-15 DIAGNOSIS — M810 Age-related osteoporosis without current pathological fracture: Secondary | ICD-10-CM | POA: Diagnosis not present

## 2020-11-15 DIAGNOSIS — Z79891 Long term (current) use of opiate analgesic: Secondary | ICD-10-CM | POA: Diagnosis not present

## 2020-11-15 DIAGNOSIS — L03115 Cellulitis of right lower limb: Secondary | ICD-10-CM | POA: Diagnosis not present

## 2020-11-17 ENCOUNTER — Other Ambulatory Visit: Payer: Self-pay

## 2020-11-17 ENCOUNTER — Ambulatory Visit (INDEPENDENT_AMBULATORY_CARE_PROVIDER_SITE_OTHER): Payer: Medicare Other | Admitting: Podiatry

## 2020-11-17 ENCOUNTER — Encounter: Payer: Self-pay | Admitting: Podiatry

## 2020-11-17 DIAGNOSIS — B351 Tinea unguium: Secondary | ICD-10-CM

## 2020-11-17 DIAGNOSIS — M79676 Pain in unspecified toe(s): Secondary | ICD-10-CM | POA: Diagnosis not present

## 2020-11-17 DIAGNOSIS — I878 Other specified disorders of veins: Secondary | ICD-10-CM

## 2020-11-17 NOTE — Progress Notes (Signed)
This patient returns to my office for at risk foot care.  This patient requires this care by a professional since this patient will be at risk due to having edema due to chronic venous insufficiency.  This patient is unable to cut nails herself since the patient cannot reach her nails.These nails are painful walking and wearing shoes.  She requests no treatment on the corn on the third toe right foot.  This patient presents for at risk foot care today.  General Appearance  Alert, conversant and in no acute stress.  Vascular  Dorsalis pedis and posterior tibial  pulses are weakly  palpable  bilaterally.  Capillary return is within normal limits  bilaterally. Cold feet  Bilaterally. Absent digital hair.  Neurologic  Senn-Weinstein monofilament wire test within normal limits  bilaterally. Muscle power within normal limits bilaterally.  Nails Thick disfigured discolored nails with subungual debris  from hallux to fifth toes bilaterally. No evidence of bacterial infection or drainage bilaterally.  Orthopedic  No limitations of motion  feet .  No crepitus or effusions noted.  No bony pathology or digital deformities noted.  Skin  normotropic skin with no porokeratosis noted bilaterally.  No signs of infections or ulcers noted.     Onychomycosis  Pain in right toes  Pain in left toes  Consent was obtained for treatment procedures.   Mechanical debridement of nails 1-5  bilaterally performed with a nail nipper.  Filed with dremel without incident.    Return office visit   3 months                  Told patient to return for periodic foot care and evaluation due to potential at risk complications.   Jancarlo Biermann DPM  

## 2020-11-29 ENCOUNTER — Ambulatory Visit: Payer: Medicare Other | Admitting: Podiatry

## 2021-02-09 DIAGNOSIS — Z79891 Long term (current) use of opiate analgesic: Secondary | ICD-10-CM | POA: Diagnosis not present

## 2021-02-09 DIAGNOSIS — E785 Hyperlipidemia, unspecified: Secondary | ICD-10-CM | POA: Diagnosis not present

## 2021-02-09 DIAGNOSIS — M810 Age-related osteoporosis without current pathological fracture: Secondary | ICD-10-CM | POA: Diagnosis not present

## 2021-02-09 DIAGNOSIS — M159 Polyosteoarthritis, unspecified: Secondary | ICD-10-CM | POA: Diagnosis not present

## 2021-02-09 DIAGNOSIS — I872 Venous insufficiency (chronic) (peripheral): Secondary | ICD-10-CM | POA: Diagnosis not present

## 2021-02-09 DIAGNOSIS — Z1339 Encounter for screening examination for other mental health and behavioral disorders: Secondary | ICD-10-CM | POA: Diagnosis not present

## 2021-02-09 DIAGNOSIS — Z1331 Encounter for screening for depression: Secondary | ICD-10-CM | POA: Diagnosis not present

## 2021-02-09 DIAGNOSIS — E538 Deficiency of other specified B group vitamins: Secondary | ICD-10-CM | POA: Diagnosis not present

## 2021-02-09 DIAGNOSIS — G8929 Other chronic pain: Secondary | ICD-10-CM | POA: Diagnosis not present

## 2021-02-09 DIAGNOSIS — Z7189 Other specified counseling: Secondary | ICD-10-CM | POA: Diagnosis not present

## 2021-02-09 DIAGNOSIS — D692 Other nonthrombocytopenic purpura: Secondary | ICD-10-CM | POA: Diagnosis not present

## 2021-02-09 DIAGNOSIS — I10 Essential (primary) hypertension: Secondary | ICD-10-CM | POA: Diagnosis not present

## 2021-02-12 DIAGNOSIS — Z23 Encounter for immunization: Secondary | ICD-10-CM | POA: Diagnosis not present

## 2021-02-21 ENCOUNTER — Encounter: Payer: Self-pay | Admitting: Podiatry

## 2021-02-21 ENCOUNTER — Ambulatory Visit (INDEPENDENT_AMBULATORY_CARE_PROVIDER_SITE_OTHER): Payer: Medicare Other | Admitting: Podiatry

## 2021-02-21 ENCOUNTER — Other Ambulatory Visit: Payer: Self-pay

## 2021-02-21 DIAGNOSIS — M79676 Pain in unspecified toe(s): Secondary | ICD-10-CM

## 2021-02-21 DIAGNOSIS — B351 Tinea unguium: Secondary | ICD-10-CM | POA: Diagnosis not present

## 2021-02-21 DIAGNOSIS — D692 Other nonthrombocytopenic purpura: Secondary | ICD-10-CM | POA: Insufficient documentation

## 2021-02-21 DIAGNOSIS — Z85810 Personal history of malignant neoplasm of tongue: Secondary | ICD-10-CM | POA: Insufficient documentation

## 2021-02-21 DIAGNOSIS — G8929 Other chronic pain: Secondary | ICD-10-CM | POA: Insufficient documentation

## 2021-02-21 DIAGNOSIS — I878 Other specified disorders of veins: Secondary | ICD-10-CM | POA: Diagnosis not present

## 2021-02-21 DIAGNOSIS — Z7409 Other reduced mobility: Secondary | ICD-10-CM | POA: Insufficient documentation

## 2021-02-21 NOTE — Progress Notes (Signed)
This patient returns to my office for at risk foot care.  This patient requires this care by a professional since this patient will be at risk due to having edema due to chronic venous insufficiency.  This patient is unable to cut nails herself since the patient cannot reach her nails.These nails are painful walking and wearing shoes.  She requests no treatment on the corn on the third toe right foot.  This patient presents for at risk foot care today.  General Appearance  Alert, conversant and in no acute stress.  Vascular  Dorsalis pedis and posterior tibial  pulses are weakly  palpable  bilaterally.  Capillary return is within normal limits  bilaterally. Cold feet  Bilaterally. Absent digital hair.  Neurologic  Senn-Weinstein monofilament wire test within normal limits  bilaterally. Muscle power within normal limits bilaterally.  Nails Thick disfigured discolored nails with subungual debris  from hallux to fifth toes bilaterally. No evidence of bacterial infection or drainage bilaterally.  Orthopedic  No limitations of motion  feet .  No crepitus or effusions noted.  No bony pathology or digital deformities noted.  Skin  normotropic skin with no porokeratosis noted bilaterally.  No signs of infections or ulcers noted.     Onychomycosis  Pain in right toes  Pain in left toes  Consent was obtained for treatment procedures.   Mechanical debridement of nails 1-5  bilaterally performed with a nail nipper.  Filed with dremel without incident.    Return office visit   3 months                  Told patient to return for periodic foot care and evaluation due to potential at risk complications.   Levana Minetti DPM  

## 2021-03-23 DIAGNOSIS — Z23 Encounter for immunization: Secondary | ICD-10-CM | POA: Diagnosis not present

## 2021-04-30 DIAGNOSIS — Z961 Presence of intraocular lens: Secondary | ICD-10-CM | POA: Diagnosis not present

## 2021-06-06 ENCOUNTER — Other Ambulatory Visit: Payer: Self-pay

## 2021-06-06 ENCOUNTER — Encounter: Payer: Self-pay | Admitting: Podiatry

## 2021-06-06 ENCOUNTER — Ambulatory Visit (INDEPENDENT_AMBULATORY_CARE_PROVIDER_SITE_OTHER): Payer: Medicare Other | Admitting: Podiatry

## 2021-06-06 DIAGNOSIS — B351 Tinea unguium: Secondary | ICD-10-CM

## 2021-06-06 DIAGNOSIS — I878 Other specified disorders of veins: Secondary | ICD-10-CM

## 2021-06-06 DIAGNOSIS — M79676 Pain in unspecified toe(s): Secondary | ICD-10-CM | POA: Diagnosis not present

## 2021-06-06 NOTE — Progress Notes (Signed)
This patient returns to my office for at risk foot care.  This patient requires this care by a professional since this patient will be at risk due to having edema due to chronic venous insufficiency.  This patient is unable to cut nails herself since the patient cannot reach her nails.These nails are painful walking and wearing shoes.  She requests no treatment on the corn on the third toe right foot.  This patient presents for at risk foot care today.  General Appearance  Alert, conversant and in no acute stress.  Vascular  Dorsalis pedis and posterior tibial  pulses are weakly  palpable  bilaterally.  Capillary return is within normal limits  bilaterally. Cold feet  Bilaterally. Absent digital hair.  Neurologic  Senn-Weinstein monofilament wire test within normal limits  bilaterally. Muscle power within normal limits bilaterally.  Nails Thick disfigured discolored nails with subungual debris  from hallux to fifth toes bilaterally. No evidence of bacterial infection or drainage bilaterally.  Orthopedic  No limitations of motion  feet .  No crepitus or effusions noted.  No bony pathology or digital deformities noted.  Skin  normotropic skin with no porokeratosis noted bilaterally.  No signs of infections or ulcers noted.     Onychomycosis  Pain in right toes  Pain in left toes  Consent was obtained for treatment procedures.   Mechanical debridement of nails 1-5  bilaterally performed with a nail nipper.  Filed with dremel without incident.    Return office visit   3 months                  Told patient to return for periodic foot care and evaluation due to potential at risk complications.   Jaxyn Mestas DPM  

## 2021-06-26 DIAGNOSIS — M159 Polyosteoarthritis, unspecified: Secondary | ICD-10-CM | POA: Diagnosis not present

## 2021-06-26 DIAGNOSIS — I872 Venous insufficiency (chronic) (peripheral): Secondary | ICD-10-CM | POA: Diagnosis not present

## 2021-06-26 DIAGNOSIS — I10 Essential (primary) hypertension: Secondary | ICD-10-CM | POA: Diagnosis not present

## 2021-06-26 DIAGNOSIS — D692 Other nonthrombocytopenic purpura: Secondary | ICD-10-CM | POA: Diagnosis not present

## 2021-06-26 DIAGNOSIS — Z1339 Encounter for screening examination for other mental health and behavioral disorders: Secondary | ICD-10-CM | POA: Diagnosis not present

## 2021-06-26 DIAGNOSIS — E441 Mild protein-calorie malnutrition: Secondary | ICD-10-CM | POA: Diagnosis not present

## 2021-06-26 DIAGNOSIS — Z7409 Other reduced mobility: Secondary | ICD-10-CM | POA: Diagnosis not present

## 2021-06-26 DIAGNOSIS — M810 Age-related osteoporosis without current pathological fracture: Secondary | ICD-10-CM | POA: Diagnosis not present

## 2021-06-26 DIAGNOSIS — Z79891 Long term (current) use of opiate analgesic: Secondary | ICD-10-CM | POA: Diagnosis not present

## 2021-06-26 DIAGNOSIS — G8929 Other chronic pain: Secondary | ICD-10-CM | POA: Diagnosis not present

## 2021-06-26 DIAGNOSIS — E538 Deficiency of other specified B group vitamins: Secondary | ICD-10-CM | POA: Diagnosis not present

## 2021-06-26 DIAGNOSIS — R634 Abnormal weight loss: Secondary | ICD-10-CM | POA: Diagnosis not present

## 2021-07-19 DIAGNOSIS — D692 Other nonthrombocytopenic purpura: Secondary | ICD-10-CM | POA: Diagnosis not present

## 2021-07-19 DIAGNOSIS — M810 Age-related osteoporosis without current pathological fracture: Secondary | ICD-10-CM | POA: Diagnosis not present

## 2021-07-19 DIAGNOSIS — E538 Deficiency of other specified B group vitamins: Secondary | ICD-10-CM | POA: Diagnosis not present

## 2021-07-19 DIAGNOSIS — Z9181 History of falling: Secondary | ICD-10-CM | POA: Diagnosis not present

## 2021-07-19 DIAGNOSIS — Z85810 Personal history of malignant neoplasm of tongue: Secondary | ICD-10-CM | POA: Diagnosis not present

## 2021-07-19 DIAGNOSIS — G8929 Other chronic pain: Secondary | ICD-10-CM | POA: Diagnosis not present

## 2021-07-19 DIAGNOSIS — E785 Hyperlipidemia, unspecified: Secondary | ICD-10-CM | POA: Diagnosis not present

## 2021-07-19 DIAGNOSIS — E441 Mild protein-calorie malnutrition: Secondary | ICD-10-CM | POA: Diagnosis not present

## 2021-07-19 DIAGNOSIS — M159 Polyosteoarthritis, unspecified: Secondary | ICD-10-CM | POA: Diagnosis not present

## 2021-07-19 DIAGNOSIS — I872 Venous insufficiency (chronic) (peripheral): Secondary | ICD-10-CM | POA: Diagnosis not present

## 2021-07-19 DIAGNOSIS — I10 Essential (primary) hypertension: Secondary | ICD-10-CM | POA: Diagnosis not present

## 2021-07-25 DIAGNOSIS — I872 Venous insufficiency (chronic) (peripheral): Secondary | ICD-10-CM | POA: Diagnosis not present

## 2021-07-25 DIAGNOSIS — E538 Deficiency of other specified B group vitamins: Secondary | ICD-10-CM | POA: Diagnosis not present

## 2021-07-25 DIAGNOSIS — I10 Essential (primary) hypertension: Secondary | ICD-10-CM | POA: Diagnosis not present

## 2021-07-25 DIAGNOSIS — M810 Age-related osteoporosis without current pathological fracture: Secondary | ICD-10-CM | POA: Diagnosis not present

## 2021-07-25 DIAGNOSIS — D692 Other nonthrombocytopenic purpura: Secondary | ICD-10-CM | POA: Diagnosis not present

## 2021-07-25 DIAGNOSIS — G8929 Other chronic pain: Secondary | ICD-10-CM | POA: Diagnosis not present

## 2021-07-27 DIAGNOSIS — I872 Venous insufficiency (chronic) (peripheral): Secondary | ICD-10-CM | POA: Diagnosis not present

## 2021-07-27 DIAGNOSIS — E538 Deficiency of other specified B group vitamins: Secondary | ICD-10-CM | POA: Diagnosis not present

## 2021-07-27 DIAGNOSIS — G8929 Other chronic pain: Secondary | ICD-10-CM | POA: Diagnosis not present

## 2021-07-27 DIAGNOSIS — M810 Age-related osteoporosis without current pathological fracture: Secondary | ICD-10-CM | POA: Diagnosis not present

## 2021-07-27 DIAGNOSIS — D692 Other nonthrombocytopenic purpura: Secondary | ICD-10-CM | POA: Diagnosis not present

## 2021-07-27 DIAGNOSIS — I10 Essential (primary) hypertension: Secondary | ICD-10-CM | POA: Diagnosis not present

## 2021-07-30 DIAGNOSIS — I872 Venous insufficiency (chronic) (peripheral): Secondary | ICD-10-CM | POA: Diagnosis not present

## 2021-07-30 DIAGNOSIS — G8929 Other chronic pain: Secondary | ICD-10-CM | POA: Diagnosis not present

## 2021-07-30 DIAGNOSIS — D692 Other nonthrombocytopenic purpura: Secondary | ICD-10-CM | POA: Diagnosis not present

## 2021-07-30 DIAGNOSIS — M810 Age-related osteoporosis without current pathological fracture: Secondary | ICD-10-CM | POA: Diagnosis not present

## 2021-07-30 DIAGNOSIS — I10 Essential (primary) hypertension: Secondary | ICD-10-CM | POA: Diagnosis not present

## 2021-07-30 DIAGNOSIS — E538 Deficiency of other specified B group vitamins: Secondary | ICD-10-CM | POA: Diagnosis not present

## 2021-08-01 DIAGNOSIS — D692 Other nonthrombocytopenic purpura: Secondary | ICD-10-CM | POA: Diagnosis not present

## 2021-08-01 DIAGNOSIS — I10 Essential (primary) hypertension: Secondary | ICD-10-CM | POA: Diagnosis not present

## 2021-08-01 DIAGNOSIS — E538 Deficiency of other specified B group vitamins: Secondary | ICD-10-CM | POA: Diagnosis not present

## 2021-08-01 DIAGNOSIS — E785 Hyperlipidemia, unspecified: Secondary | ICD-10-CM | POA: Diagnosis not present

## 2021-08-01 DIAGNOSIS — M810 Age-related osteoporosis without current pathological fracture: Secondary | ICD-10-CM | POA: Diagnosis not present

## 2021-08-01 DIAGNOSIS — I872 Venous insufficiency (chronic) (peripheral): Secondary | ICD-10-CM | POA: Diagnosis not present

## 2021-08-01 DIAGNOSIS — G8929 Other chronic pain: Secondary | ICD-10-CM | POA: Diagnosis not present

## 2021-08-06 DIAGNOSIS — I872 Venous insufficiency (chronic) (peripheral): Secondary | ICD-10-CM | POA: Diagnosis not present

## 2021-08-06 DIAGNOSIS — D692 Other nonthrombocytopenic purpura: Secondary | ICD-10-CM | POA: Diagnosis not present

## 2021-08-06 DIAGNOSIS — E538 Deficiency of other specified B group vitamins: Secondary | ICD-10-CM | POA: Diagnosis not present

## 2021-08-06 DIAGNOSIS — M810 Age-related osteoporosis without current pathological fracture: Secondary | ICD-10-CM | POA: Diagnosis not present

## 2021-08-06 DIAGNOSIS — G8929 Other chronic pain: Secondary | ICD-10-CM | POA: Diagnosis not present

## 2021-08-06 DIAGNOSIS — I10 Essential (primary) hypertension: Secondary | ICD-10-CM | POA: Diagnosis not present

## 2021-08-07 DIAGNOSIS — I872 Venous insufficiency (chronic) (peripheral): Secondary | ICD-10-CM | POA: Diagnosis not present

## 2021-08-07 DIAGNOSIS — Z85810 Personal history of malignant neoplasm of tongue: Secondary | ICD-10-CM | POA: Diagnosis not present

## 2021-08-07 DIAGNOSIS — M810 Age-related osteoporosis without current pathological fracture: Secondary | ICD-10-CM | POA: Diagnosis not present

## 2021-08-07 DIAGNOSIS — G8929 Other chronic pain: Secondary | ICD-10-CM | POA: Diagnosis not present

## 2021-08-07 DIAGNOSIS — M159 Polyosteoarthritis, unspecified: Secondary | ICD-10-CM | POA: Diagnosis not present

## 2021-08-07 DIAGNOSIS — D692 Other nonthrombocytopenic purpura: Secondary | ICD-10-CM | POA: Diagnosis not present

## 2021-08-07 DIAGNOSIS — Z79891 Long term (current) use of opiate analgesic: Secondary | ICD-10-CM | POA: Diagnosis not present

## 2021-08-07 DIAGNOSIS — E441 Mild protein-calorie malnutrition: Secondary | ICD-10-CM | POA: Diagnosis not present

## 2021-08-07 DIAGNOSIS — E785 Hyperlipidemia, unspecified: Secondary | ICD-10-CM | POA: Diagnosis not present

## 2021-08-07 DIAGNOSIS — E876 Hypokalemia: Secondary | ICD-10-CM | POA: Diagnosis not present

## 2021-08-07 DIAGNOSIS — I10 Essential (primary) hypertension: Secondary | ICD-10-CM | POA: Diagnosis not present

## 2021-08-07 DIAGNOSIS — Z7409 Other reduced mobility: Secondary | ICD-10-CM | POA: Diagnosis not present

## 2021-08-13 DIAGNOSIS — E538 Deficiency of other specified B group vitamins: Secondary | ICD-10-CM | POA: Diagnosis not present

## 2021-08-13 DIAGNOSIS — M810 Age-related osteoporosis without current pathological fracture: Secondary | ICD-10-CM | POA: Diagnosis not present

## 2021-08-13 DIAGNOSIS — G8929 Other chronic pain: Secondary | ICD-10-CM | POA: Diagnosis not present

## 2021-08-13 DIAGNOSIS — I872 Venous insufficiency (chronic) (peripheral): Secondary | ICD-10-CM | POA: Diagnosis not present

## 2021-08-13 DIAGNOSIS — D692 Other nonthrombocytopenic purpura: Secondary | ICD-10-CM | POA: Diagnosis not present

## 2021-08-13 DIAGNOSIS — I10 Essential (primary) hypertension: Secondary | ICD-10-CM | POA: Diagnosis not present

## 2021-08-18 DIAGNOSIS — M810 Age-related osteoporosis without current pathological fracture: Secondary | ICD-10-CM | POA: Diagnosis not present

## 2021-08-18 DIAGNOSIS — I872 Venous insufficiency (chronic) (peripheral): Secondary | ICD-10-CM | POA: Diagnosis not present

## 2021-08-18 DIAGNOSIS — E785 Hyperlipidemia, unspecified: Secondary | ICD-10-CM | POA: Diagnosis not present

## 2021-08-18 DIAGNOSIS — G8929 Other chronic pain: Secondary | ICD-10-CM | POA: Diagnosis not present

## 2021-08-18 DIAGNOSIS — Z9181 History of falling: Secondary | ICD-10-CM | POA: Diagnosis not present

## 2021-08-18 DIAGNOSIS — I10 Essential (primary) hypertension: Secondary | ICD-10-CM | POA: Diagnosis not present

## 2021-08-18 DIAGNOSIS — M159 Polyosteoarthritis, unspecified: Secondary | ICD-10-CM | POA: Diagnosis not present

## 2021-08-18 DIAGNOSIS — E538 Deficiency of other specified B group vitamins: Secondary | ICD-10-CM | POA: Diagnosis not present

## 2021-08-18 DIAGNOSIS — E441 Mild protein-calorie malnutrition: Secondary | ICD-10-CM | POA: Diagnosis not present

## 2021-08-18 DIAGNOSIS — D692 Other nonthrombocytopenic purpura: Secondary | ICD-10-CM | POA: Diagnosis not present

## 2021-08-18 DIAGNOSIS — Z85810 Personal history of malignant neoplasm of tongue: Secondary | ICD-10-CM | POA: Diagnosis not present

## 2021-08-21 DIAGNOSIS — D692 Other nonthrombocytopenic purpura: Secondary | ICD-10-CM | POA: Diagnosis not present

## 2021-08-21 DIAGNOSIS — E538 Deficiency of other specified B group vitamins: Secondary | ICD-10-CM | POA: Diagnosis not present

## 2021-08-21 DIAGNOSIS — I10 Essential (primary) hypertension: Secondary | ICD-10-CM | POA: Diagnosis not present

## 2021-08-21 DIAGNOSIS — I872 Venous insufficiency (chronic) (peripheral): Secondary | ICD-10-CM | POA: Diagnosis not present

## 2021-08-21 DIAGNOSIS — G8929 Other chronic pain: Secondary | ICD-10-CM | POA: Diagnosis not present

## 2021-08-21 DIAGNOSIS — M810 Age-related osteoporosis without current pathological fracture: Secondary | ICD-10-CM | POA: Diagnosis not present

## 2021-09-04 DIAGNOSIS — D692 Other nonthrombocytopenic purpura: Secondary | ICD-10-CM | POA: Diagnosis not present

## 2021-09-04 DIAGNOSIS — G8929 Other chronic pain: Secondary | ICD-10-CM | POA: Diagnosis not present

## 2021-09-04 DIAGNOSIS — I872 Venous insufficiency (chronic) (peripheral): Secondary | ICD-10-CM | POA: Diagnosis not present

## 2021-09-04 DIAGNOSIS — I10 Essential (primary) hypertension: Secondary | ICD-10-CM | POA: Diagnosis not present

## 2021-09-04 DIAGNOSIS — E538 Deficiency of other specified B group vitamins: Secondary | ICD-10-CM | POA: Diagnosis not present

## 2021-09-04 DIAGNOSIS — M810 Age-related osteoporosis without current pathological fracture: Secondary | ICD-10-CM | POA: Diagnosis not present

## 2021-09-12 ENCOUNTER — Encounter: Payer: Self-pay | Admitting: Podiatry

## 2021-09-12 ENCOUNTER — Other Ambulatory Visit: Payer: Self-pay

## 2021-09-12 ENCOUNTER — Ambulatory Visit (INDEPENDENT_AMBULATORY_CARE_PROVIDER_SITE_OTHER): Payer: Medicare Other | Admitting: Podiatry

## 2021-09-12 DIAGNOSIS — M810 Age-related osteoporosis without current pathological fracture: Secondary | ICD-10-CM | POA: Diagnosis not present

## 2021-09-12 DIAGNOSIS — I878 Other specified disorders of veins: Secondary | ICD-10-CM | POA: Diagnosis not present

## 2021-09-12 DIAGNOSIS — I10 Essential (primary) hypertension: Secondary | ICD-10-CM | POA: Diagnosis not present

## 2021-09-12 DIAGNOSIS — M79676 Pain in unspecified toe(s): Secondary | ICD-10-CM | POA: Diagnosis not present

## 2021-09-12 DIAGNOSIS — D692 Other nonthrombocytopenic purpura: Secondary | ICD-10-CM | POA: Diagnosis not present

## 2021-09-12 DIAGNOSIS — B351 Tinea unguium: Secondary | ICD-10-CM

## 2021-09-12 DIAGNOSIS — E538 Deficiency of other specified B group vitamins: Secondary | ICD-10-CM | POA: Diagnosis not present

## 2021-09-12 DIAGNOSIS — I872 Venous insufficiency (chronic) (peripheral): Secondary | ICD-10-CM | POA: Diagnosis not present

## 2021-09-12 DIAGNOSIS — G8929 Other chronic pain: Secondary | ICD-10-CM | POA: Diagnosis not present

## 2021-09-12 NOTE — Progress Notes (Signed)
This patient returns to my office for at risk foot care.  This patient requires this care by a professional since this patient will be at risk due to having edema due to chronic venous insufficiency.  This patient is unable to cut nails herself since the patient cannot reach her nails.These nails are painful walking and wearing shoes.  She requests no treatment on the corn on the third toe right foot.  This patient presents for at risk foot care today.  General Appearance  Alert, conversant and in no acute stress.  Vascular  Dorsalis pedis and posterior tibial  pulses are weakly  palpable  bilaterally.  Capillary return is within normal limits  bilaterally. Cold feet  Bilaterally. Absent digital hair.  Neurologic  Senn-Weinstein monofilament wire test within normal limits  bilaterally. Muscle power within normal limits bilaterally.  Nails Thick disfigured discolored nails with subungual debris  from hallux to fifth toes bilaterally. No evidence of bacterial infection or drainage bilaterally.  Orthopedic  No limitations of motion  feet .  No crepitus or effusions noted.  No bony pathology or digital deformities noted.  Skin  normotropic skin with no porokeratosis noted bilaterally.  No signs of infections or ulcers noted.     Onychomycosis  Pain in right toes  Pain in left toes  Consent was obtained for treatment procedures.   Mechanical debridement of nails 1-5  bilaterally performed with a nail nipper.  Filed with dremel without incident.    Return office visit   3 months                  Told patient to return for periodic foot care and evaluation due to potential at risk complications.   Germany Chelf DPM  

## 2021-11-07 DIAGNOSIS — I10 Essential (primary) hypertension: Secondary | ICD-10-CM | POA: Diagnosis not present

## 2021-11-07 DIAGNOSIS — M810 Age-related osteoporosis without current pathological fracture: Secondary | ICD-10-CM | POA: Diagnosis not present

## 2021-11-07 DIAGNOSIS — E538 Deficiency of other specified B group vitamins: Secondary | ICD-10-CM | POA: Diagnosis not present

## 2021-11-07 DIAGNOSIS — E785 Hyperlipidemia, unspecified: Secondary | ICD-10-CM | POA: Diagnosis not present

## 2021-11-14 DIAGNOSIS — M810 Age-related osteoporosis without current pathological fracture: Secondary | ICD-10-CM | POA: Diagnosis not present

## 2021-11-14 DIAGNOSIS — Z0000 Encounter for general adult medical examination without abnormal findings: Secondary | ICD-10-CM | POA: Diagnosis not present

## 2021-11-14 DIAGNOSIS — Z7409 Other reduced mobility: Secondary | ICD-10-CM | POA: Diagnosis not present

## 2021-11-14 DIAGNOSIS — Z79891 Long term (current) use of opiate analgesic: Secondary | ICD-10-CM | POA: Diagnosis not present

## 2021-11-14 DIAGNOSIS — R634 Abnormal weight loss: Secondary | ICD-10-CM | POA: Diagnosis not present

## 2021-11-14 DIAGNOSIS — D692 Other nonthrombocytopenic purpura: Secondary | ICD-10-CM | POA: Diagnosis not present

## 2021-11-14 DIAGNOSIS — I10 Essential (primary) hypertension: Secondary | ICD-10-CM | POA: Diagnosis not present

## 2021-11-14 DIAGNOSIS — E876 Hypokalemia: Secondary | ICD-10-CM | POA: Diagnosis not present

## 2021-11-14 DIAGNOSIS — E785 Hyperlipidemia, unspecified: Secondary | ICD-10-CM | POA: Diagnosis not present

## 2021-11-14 DIAGNOSIS — M159 Polyosteoarthritis, unspecified: Secondary | ICD-10-CM | POA: Diagnosis not present

## 2021-11-14 DIAGNOSIS — G8929 Other chronic pain: Secondary | ICD-10-CM | POA: Diagnosis not present

## 2021-11-14 DIAGNOSIS — R82998 Other abnormal findings in urine: Secondary | ICD-10-CM | POA: Diagnosis not present

## 2021-11-14 DIAGNOSIS — E441 Mild protein-calorie malnutrition: Secondary | ICD-10-CM | POA: Diagnosis not present

## 2021-12-19 ENCOUNTER — Encounter: Payer: Self-pay | Admitting: Podiatry

## 2021-12-19 ENCOUNTER — Ambulatory Visit (INDEPENDENT_AMBULATORY_CARE_PROVIDER_SITE_OTHER): Payer: Medicare Other | Admitting: Podiatry

## 2021-12-19 DIAGNOSIS — I878 Other specified disorders of veins: Secondary | ICD-10-CM

## 2021-12-19 DIAGNOSIS — B351 Tinea unguium: Secondary | ICD-10-CM

## 2021-12-19 DIAGNOSIS — M79676 Pain in unspecified toe(s): Secondary | ICD-10-CM

## 2021-12-19 NOTE — Progress Notes (Signed)
This patient returns to my office for at risk foot care.  This patient requires this care by a professional since this patient will be at risk due to having edema due to chronic venous insufficiency.  This patient is unable to cut nails herself since the patient cannot reach her nails.These nails are painful walking and wearing shoes.  She requests no treatment on the corn on the third toe right foot.  This patient presents for at risk foot care today.  General Appearance  Alert, conversant and in no acute stress.  Vascular  Dorsalis pedis and posterior tibial  pulses are weakly  palpable  bilaterally.  Capillary return is within normal limits  bilaterally. Cold feet  Bilaterally. Absent digital hair.  Neurologic  Senn-Weinstein monofilament wire test within normal limits  bilaterally. Muscle power within normal limits bilaterally.  Nails Thick disfigured discolored nails with subungual debris  from hallux to fifth toes bilaterally. No evidence of bacterial infection or drainage bilaterally.  Orthopedic  No limitations of motion  feet .  No crepitus or effusions noted.  No bony pathology or digital deformities noted.  Skin  normotropic skin with no porokeratosis noted bilaterally.  No signs of infections or ulcers noted.     Onychomycosis  Pain in right toes  Pain in left toes  Consent was obtained for treatment procedures.   Mechanical debridement of nails 1-5  bilaterally performed with a nail nipper.  Filed with dremel without incident.    Return office visit   3 months                  Told patient to return for periodic foot care and evaluation due to potential at risk complications.   Chukwuebuka Churchill DPM  

## 2022-03-27 ENCOUNTER — Ambulatory Visit: Admitting: Podiatry

## 2022-04-05 ENCOUNTER — Encounter: Payer: Self-pay | Admitting: Podiatry

## 2022-04-05 ENCOUNTER — Ambulatory Visit (INDEPENDENT_AMBULATORY_CARE_PROVIDER_SITE_OTHER): Payer: Medicare Other | Admitting: Podiatry

## 2022-04-05 DIAGNOSIS — B351 Tinea unguium: Secondary | ICD-10-CM | POA: Diagnosis not present

## 2022-04-05 DIAGNOSIS — I872 Venous insufficiency (chronic) (peripheral): Secondary | ICD-10-CM

## 2022-04-05 DIAGNOSIS — M79676 Pain in unspecified toe(s): Secondary | ICD-10-CM | POA: Diagnosis not present

## 2022-04-05 DIAGNOSIS — I878 Other specified disorders of veins: Secondary | ICD-10-CM

## 2022-04-05 NOTE — Progress Notes (Signed)
This patient returns to my office for at risk foot care.  This patient requires this care by a professional since this patient will be at risk due to having edema due to chronic venous insufficiency.  This patient is unable to cut nails herself since the patient cannot reach her nails.These nails are painful walking and wearing shoes.  She requests no treatment on the corn on the third toe right foot.  This patient presents for at risk foot care today.  General Appearance  Alert, conversant and in no acute stress.  Vascular  Dorsalis pedis and posterior tibial  pulses are weakly  palpable  bilaterally.  Capillary return is within normal limits  bilaterally. Cold feet  Bilaterally. Absent digital hair.  Neurologic  Senn-Weinstein monofilament wire test within normal limits  bilaterally. Muscle power within normal limits bilaterally.  Nails Thick disfigured discolored nails with subungual debris  from hallux to fifth toes bilaterally. No evidence of bacterial infection or drainage bilaterally.  Orthopedic  No limitations of motion  feet .  No crepitus or effusions noted.  No bony pathology or digital deformities noted.  Skin  normotropic skin with no porokeratosis noted bilaterally.  No signs of infections or ulcers noted.     Onychomycosis  Pain in right toes  Pain in left toes  Consent was obtained for treatment procedures.   Mechanical debridement of nails 1-5  bilaterally performed with a nail nipper.  Filed with dremel without incident.    Return office visit   3 months                  Told patient to return for periodic foot care and evaluation due to potential at risk complications.   Gardiner Barefoot DPM

## 2022-05-14 DIAGNOSIS — Z23 Encounter for immunization: Secondary | ICD-10-CM | POA: Diagnosis not present

## 2022-05-14 DIAGNOSIS — M810 Age-related osteoporosis without current pathological fracture: Secondary | ICD-10-CM | POA: Diagnosis not present

## 2022-05-14 DIAGNOSIS — M159 Polyosteoarthritis, unspecified: Secondary | ICD-10-CM | POA: Diagnosis not present

## 2022-05-14 DIAGNOSIS — E785 Hyperlipidemia, unspecified: Secondary | ICD-10-CM | POA: Diagnosis not present

## 2022-05-14 DIAGNOSIS — D692 Other nonthrombocytopenic purpura: Secondary | ICD-10-CM | POA: Diagnosis not present

## 2022-05-14 DIAGNOSIS — Z7409 Other reduced mobility: Secondary | ICD-10-CM | POA: Diagnosis not present

## 2022-05-14 DIAGNOSIS — Z79891 Long term (current) use of opiate analgesic: Secondary | ICD-10-CM | POA: Diagnosis not present

## 2022-05-14 DIAGNOSIS — G8929 Other chronic pain: Secondary | ICD-10-CM | POA: Diagnosis not present

## 2022-05-14 DIAGNOSIS — E876 Hypokalemia: Secondary | ICD-10-CM | POA: Diagnosis not present

## 2022-05-14 DIAGNOSIS — I10 Essential (primary) hypertension: Secondary | ICD-10-CM | POA: Diagnosis not present

## 2022-05-14 DIAGNOSIS — R634 Abnormal weight loss: Secondary | ICD-10-CM | POA: Diagnosis not present

## 2022-05-14 DIAGNOSIS — Z85810 Personal history of malignant neoplasm of tongue: Secondary | ICD-10-CM | POA: Diagnosis not present

## 2022-06-14 DIAGNOSIS — G8929 Other chronic pain: Secondary | ICD-10-CM | POA: Diagnosis not present

## 2022-06-14 DIAGNOSIS — I872 Venous insufficiency (chronic) (peripheral): Secondary | ICD-10-CM | POA: Diagnosis not present

## 2022-06-14 DIAGNOSIS — E876 Hypokalemia: Secondary | ICD-10-CM | POA: Diagnosis not present

## 2022-06-14 DIAGNOSIS — M810 Age-related osteoporosis without current pathological fracture: Secondary | ICD-10-CM | POA: Diagnosis not present

## 2022-06-14 DIAGNOSIS — Z85810 Personal history of malignant neoplasm of tongue: Secondary | ICD-10-CM | POA: Diagnosis not present

## 2022-06-14 DIAGNOSIS — E441 Mild protein-calorie malnutrition: Secondary | ICD-10-CM | POA: Diagnosis not present

## 2022-06-14 DIAGNOSIS — Z79891 Long term (current) use of opiate analgesic: Secondary | ICD-10-CM | POA: Diagnosis not present

## 2022-06-14 DIAGNOSIS — Z7409 Other reduced mobility: Secondary | ICD-10-CM | POA: Diagnosis not present

## 2022-06-14 DIAGNOSIS — E785 Hyperlipidemia, unspecified: Secondary | ICD-10-CM | POA: Diagnosis not present

## 2022-06-14 DIAGNOSIS — I10 Essential (primary) hypertension: Secondary | ICD-10-CM | POA: Diagnosis not present

## 2022-06-14 DIAGNOSIS — M159 Polyosteoarthritis, unspecified: Secondary | ICD-10-CM | POA: Diagnosis not present

## 2022-06-14 DIAGNOSIS — D692 Other nonthrombocytopenic purpura: Secondary | ICD-10-CM | POA: Diagnosis not present

## 2022-07-10 ENCOUNTER — Ambulatory Visit: Payer: Medicare Other | Admitting: Podiatry

## 2022-08-01 DIAGNOSIS — Z79891 Long term (current) use of opiate analgesic: Secondary | ICD-10-CM | POA: Diagnosis not present

## 2022-08-01 DIAGNOSIS — I10 Essential (primary) hypertension: Secondary | ICD-10-CM | POA: Diagnosis not present

## 2022-08-01 DIAGNOSIS — D692 Other nonthrombocytopenic purpura: Secondary | ICD-10-CM | POA: Diagnosis not present

## 2022-08-01 DIAGNOSIS — I872 Venous insufficiency (chronic) (peripheral): Secondary | ICD-10-CM | POA: Diagnosis not present

## 2022-08-01 DIAGNOSIS — M159 Polyosteoarthritis, unspecified: Secondary | ICD-10-CM | POA: Diagnosis not present

## 2022-08-01 DIAGNOSIS — M810 Age-related osteoporosis without current pathological fracture: Secondary | ICD-10-CM | POA: Diagnosis not present

## 2022-08-01 DIAGNOSIS — Z7409 Other reduced mobility: Secondary | ICD-10-CM | POA: Diagnosis not present

## 2022-08-01 DIAGNOSIS — G8929 Other chronic pain: Secondary | ICD-10-CM | POA: Diagnosis not present

## 2022-08-01 DIAGNOSIS — E876 Hypokalemia: Secondary | ICD-10-CM | POA: Diagnosis not present

## 2022-08-01 DIAGNOSIS — E785 Hyperlipidemia, unspecified: Secondary | ICD-10-CM | POA: Diagnosis not present

## 2022-08-01 DIAGNOSIS — E269 Hyperaldosteronism, unspecified: Secondary | ICD-10-CM | POA: Diagnosis not present

## 2022-08-01 DIAGNOSIS — E441 Mild protein-calorie malnutrition: Secondary | ICD-10-CM | POA: Diagnosis not present

## 2022-09-12 ENCOUNTER — Ambulatory Visit (INDEPENDENT_AMBULATORY_CARE_PROVIDER_SITE_OTHER): Payer: Medicare Other | Admitting: Podiatry

## 2022-09-12 DIAGNOSIS — I878 Other specified disorders of veins: Secondary | ICD-10-CM

## 2022-09-12 DIAGNOSIS — B351 Tinea unguium: Secondary | ICD-10-CM

## 2022-09-12 DIAGNOSIS — M79676 Pain in unspecified toe(s): Secondary | ICD-10-CM

## 2022-09-12 DIAGNOSIS — I872 Venous insufficiency (chronic) (peripheral): Secondary | ICD-10-CM

## 2022-09-12 NOTE — Progress Notes (Signed)
  Subjective:  Patient ID: Stephanie [name], female    DOB: 12-26-1925,  MRN: [id number]  Chief Complaint  Patient presents with   Nail Problem    RFC Nail trim    89 y.o. female presents with the above complaint. History confirmed with patient. Patient presenting with pain related to dystrophic thickened elongated nails. Patient is unable to trim own nails related to nail dystrophy and/or mobility issues. Patient does not have a history of T2DM.   Objective:  Physical Exam: warm, good capillary refill nail exam onychomycosis of the toenails, onycholysis, and dystrophic nails DP pulses palpable, PT pulses palpable, and protective sensation intact Left Foot:  Pain with palpation of nails due to elongation and dystrophic growth.  Right Foot: Pain with palpation of nails due to elongation and dystrophic growth.   Assessment:   1. Pain due to onychomycosis of toenail   2. Venous stasis   3. Peripheral venous insufficiency      Plan:  Patient was evaluated and treated and all questions answered.  #Onychomycosis with pain  -Nails palliatively debrided as below. -Educated on self-care  Procedure: Nail Debridement Rationale: Pain Type of Debridement: manual, sharp debridement. Instrumentation: Nail nipper, rotary burr. Number of Nails: 10  No follow-ups on file.         Everitt Amber, DPM [facility]

## 2022-11-21 DIAGNOSIS — I10 Essential (primary) hypertension: Secondary | ICD-10-CM | POA: Diagnosis not present

## 2022-11-21 DIAGNOSIS — E538 Deficiency of other specified B group vitamins: Secondary | ICD-10-CM | POA: Diagnosis not present

## 2022-11-21 DIAGNOSIS — M810 Age-related osteoporosis without current pathological fracture: Secondary | ICD-10-CM | POA: Diagnosis not present

## 2022-11-21 DIAGNOSIS — R7989 Other specified abnormal findings of blood chemistry: Secondary | ICD-10-CM | POA: Diagnosis not present

## 2022-11-21 DIAGNOSIS — E785 Hyperlipidemia, unspecified: Secondary | ICD-10-CM | POA: Diagnosis not present

## 2022-11-28 DIAGNOSIS — I10 Essential (primary) hypertension: Secondary | ICD-10-CM | POA: Diagnosis not present

## 2022-11-28 DIAGNOSIS — E441 Mild protein-calorie malnutrition: Secondary | ICD-10-CM | POA: Diagnosis not present

## 2022-11-28 DIAGNOSIS — G8929 Other chronic pain: Secondary | ICD-10-CM | POA: Diagnosis not present

## 2022-11-28 DIAGNOSIS — Z0000 Encounter for general adult medical examination without abnormal findings: Secondary | ICD-10-CM | POA: Diagnosis not present

## 2022-11-28 DIAGNOSIS — Z1331 Encounter for screening for depression: Secondary | ICD-10-CM | POA: Diagnosis not present

## 2022-11-28 DIAGNOSIS — I872 Venous insufficiency (chronic) (peripheral): Secondary | ICD-10-CM | POA: Diagnosis not present

## 2022-11-28 DIAGNOSIS — R82998 Other abnormal findings in urine: Secondary | ICD-10-CM | POA: Diagnosis not present

## 2022-11-28 DIAGNOSIS — Z1339 Encounter for screening examination for other mental health and behavioral disorders: Secondary | ICD-10-CM | POA: Diagnosis not present

## 2022-11-28 DIAGNOSIS — Z79891 Long term (current) use of opiate analgesic: Secondary | ICD-10-CM | POA: Diagnosis not present

## 2022-11-28 DIAGNOSIS — D692 Other nonthrombocytopenic purpura: Secondary | ICD-10-CM | POA: Diagnosis not present

## 2022-11-28 DIAGNOSIS — M159 Polyosteoarthritis, unspecified: Secondary | ICD-10-CM | POA: Diagnosis not present

## 2022-11-28 DIAGNOSIS — E785 Hyperlipidemia, unspecified: Secondary | ICD-10-CM | POA: Diagnosis not present

## 2022-11-28 DIAGNOSIS — M810 Age-related osteoporosis without current pathological fracture: Secondary | ICD-10-CM | POA: Diagnosis not present

## 2022-12-12 ENCOUNTER — Encounter: Payer: Self-pay | Admitting: Podiatry

## 2022-12-12 ENCOUNTER — Ambulatory Visit (INDEPENDENT_AMBULATORY_CARE_PROVIDER_SITE_OTHER): Payer: Medicare Other | Admitting: Podiatry

## 2022-12-12 DIAGNOSIS — I872 Venous insufficiency (chronic) (peripheral): Secondary | ICD-10-CM

## 2022-12-12 DIAGNOSIS — B351 Tinea unguium: Secondary | ICD-10-CM

## 2022-12-12 DIAGNOSIS — M79676 Pain in unspecified toe(s): Secondary | ICD-10-CM | POA: Diagnosis not present

## 2022-12-12 NOTE — Progress Notes (Signed)
This patient returns to my office for at risk foot care.  This patient requires this care by a professional since this patient will be at risk due to having edema due to chronic venous insufficiency.  This patient is unable to cut nails herself since the patient cannot reach her nails.These nails are painful walking and wearing shoes.  She requests no treatment on the corn on the third toe right foot.  This patient presents for at risk foot care today.  General Appearance  Alert, conversant and in no acute stress.  Vascular  Dorsalis pedis and posterior tibial  pulses are weakly  palpable  bilaterally.  Capillary return is within normal limits  bilaterally. Cold feet  Bilaterally. Absent digital hair.  Neurologic  Senn-Weinstein monofilament wire test within normal limits  bilaterally. Muscle power within normal limits bilaterally.  Nails Thick disfigured discolored nails with subungual debris  from hallux to fifth toes bilaterally. No evidence of bacterial infection or drainage bilaterally.  Orthopedic  No limitations of motion  feet .  No crepitus or effusions noted.  No bony pathology or digital deformities noted.  Skin  normotropic skin with no porokeratosis noted bilaterally.  No signs of infections or ulcers noted.     Onychomycosis  Pain in right toes  Pain in left toes  Consent was obtained for treatment procedures.   Mechanical debridement of nails 1-5  bilaterally performed with a nail nipper.  Filed with dremel without incident.    Return office visit   3 months                  Told patient to return for periodic foot care and evaluation due to potential at risk complications.   Tiawana Forgy DPM  

## 2023-03-13 ENCOUNTER — Encounter: Payer: Self-pay | Admitting: Podiatry

## 2023-03-13 ENCOUNTER — Ambulatory Visit (INDEPENDENT_AMBULATORY_CARE_PROVIDER_SITE_OTHER): Payer: Medicare Other | Admitting: Podiatry

## 2023-03-13 DIAGNOSIS — I872 Venous insufficiency (chronic) (peripheral): Secondary | ICD-10-CM | POA: Diagnosis not present

## 2023-03-13 DIAGNOSIS — B351 Tinea unguium: Secondary | ICD-10-CM

## 2023-03-13 DIAGNOSIS — M79676 Pain in unspecified toe(s): Secondary | ICD-10-CM | POA: Diagnosis not present

## 2023-03-13 NOTE — Progress Notes (Signed)
This patient returns to my office for at risk foot care.  This patient requires this care by a professional since this patient will be at risk due to having edema due to chronic venous insufficiency.  This patient is unable to cut nails herself since the patient cannot reach her nails.These nails are painful walking and wearing shoes.  She requests no treatment on the corn on the third toe right foot.  This patient presents for at risk foot care today.  General Appearance  Alert, conversant and in no acute stress.  Vascular  Dorsalis pedis and posterior tibial  pulses are weakly  palpable  bilaterally.  Capillary return is within normal limits  bilaterally. Cold feet  Bilaterally. Absent digital hair.  Neurologic  Senn-Weinstein monofilament wire test within normal limits  bilaterally. Muscle power within normal limits bilaterally.  Nails Thick disfigured discolored nails with subungual debris  from hallux to fifth toes bilaterally. No evidence of bacterial infection or drainage bilaterally.  Orthopedic  No limitations of motion  feet .  No crepitus or effusions noted.  No bony pathology or digital deformities noted.  Skin  normotropic skin with no porokeratosis noted bilaterally.  No signs of infections or ulcers noted.     Onychomycosis  Pain in right toes  Pain in left toes  Consent was obtained for treatment procedures.   Mechanical debridement of nails 1-5  bilaterally performed with a nail nipper.  Filed with dremel without incident.    Return office visit   3 months                  Told patient to return for periodic foot care and evaluation due to potential at risk complications.   Gregory Mayer DPM  

## 2023-03-25 DIAGNOSIS — Z23 Encounter for immunization: Secondary | ICD-10-CM | POA: Diagnosis not present

## 2023-05-27 DIAGNOSIS — I10 Essential (primary) hypertension: Secondary | ICD-10-CM | POA: Diagnosis not present

## 2023-05-27 DIAGNOSIS — E538 Deficiency of other specified B group vitamins: Secondary | ICD-10-CM | POA: Diagnosis not present

## 2023-05-27 DIAGNOSIS — M7500 Adhesive capsulitis of unspecified shoulder: Secondary | ICD-10-CM | POA: Diagnosis not present

## 2023-05-27 DIAGNOSIS — R634 Abnormal weight loss: Secondary | ICD-10-CM | POA: Diagnosis not present

## 2023-05-27 DIAGNOSIS — Z79891 Long term (current) use of opiate analgesic: Secondary | ICD-10-CM | POA: Diagnosis not present

## 2023-05-27 DIAGNOSIS — Z23 Encounter for immunization: Secondary | ICD-10-CM | POA: Diagnosis not present

## 2023-05-27 DIAGNOSIS — E269 Hyperaldosteronism, unspecified: Secondary | ICD-10-CM | POA: Diagnosis not present

## 2023-05-27 DIAGNOSIS — I872 Venous insufficiency (chronic) (peripheral): Secondary | ICD-10-CM | POA: Diagnosis not present

## 2023-05-27 DIAGNOSIS — Z7409 Other reduced mobility: Secondary | ICD-10-CM | POA: Diagnosis not present

## 2023-05-27 DIAGNOSIS — Z85810 Personal history of malignant neoplasm of tongue: Secondary | ICD-10-CM | POA: Diagnosis not present

## 2023-05-27 DIAGNOSIS — M810 Age-related osteoporosis without current pathological fracture: Secondary | ICD-10-CM | POA: Diagnosis not present

## 2023-06-05 DIAGNOSIS — M8588 Other specified disorders of bone density and structure, other site: Secondary | ICD-10-CM | POA: Diagnosis not present

## 2023-06-05 DIAGNOSIS — M810 Age-related osteoporosis without current pathological fracture: Secondary | ICD-10-CM | POA: Diagnosis not present

## 2023-06-05 DIAGNOSIS — E2839 Other primary ovarian failure: Secondary | ICD-10-CM | POA: Diagnosis not present

## 2023-06-05 DIAGNOSIS — R29890 Loss of height: Secondary | ICD-10-CM | POA: Diagnosis not present

## 2023-06-18 ENCOUNTER — Ambulatory Visit: Payer: Medicare Other | Admitting: Podiatry

## 2023-07-03 ENCOUNTER — Ambulatory Visit: Payer: Medicare Other | Admitting: Podiatry

## 2023-09-18 ENCOUNTER — Ambulatory Visit (INDEPENDENT_AMBULATORY_CARE_PROVIDER_SITE_OTHER): Payer: Medicare Other | Admitting: Podiatry

## 2023-09-18 ENCOUNTER — Encounter: Payer: Self-pay | Admitting: Podiatry

## 2023-09-18 VITALS — Ht 59.0 in | Wt 112.0 lb

## 2023-09-18 DIAGNOSIS — M79676 Pain in unspecified toe(s): Secondary | ICD-10-CM

## 2023-09-18 DIAGNOSIS — I872 Venous insufficiency (chronic) (peripheral): Secondary | ICD-10-CM

## 2023-09-18 DIAGNOSIS — B351 Tinea unguium: Secondary | ICD-10-CM

## 2023-09-18 NOTE — Progress Notes (Signed)
This patient returns to my office for at risk foot care.  This patient requires this care by a professional since this patient will be at risk due to having edema due to chronic venous insufficiency.  This patient is unable to cut nails herself since the patient cannot reach her nails.These nails are painful walking and wearing shoes.  She requests no treatment on the corn on the third toe right foot.  This patient presents for at risk foot care today.  General Appearance  Alert, conversant and in no acute stress.  Vascular  Dorsalis pedis and posterior tibial  pulses are weakly  palpable  bilaterally.  Capillary return is within normal limits  bilaterally. Cold feet  Bilaterally. Absent digital hair.  Neurologic  Senn-Weinstein monofilament wire test within normal limits  bilaterally. Muscle power within normal limits bilaterally.  Nails Thick disfigured discolored nails with subungual debris  from hallux to fifth toes bilaterally. No evidence of bacterial infection or drainage bilaterally.  Orthopedic  No limitations of motion  feet .  No crepitus or effusions noted.  No bony pathology or digital deformities noted.  Skin  normotropic skin with no porokeratosis noted bilaterally.  No signs of infections or ulcers noted.     Onychomycosis  Pain in right toes  Pain in left toes  Consent was obtained for treatment procedures.   Mechanical debridement of nails 1-5  bilaterally performed with a nail nipper.  Filed with dremel without incident.    Return office visit   3 months                  Told patient to return for periodic foot care and evaluation due to potential at risk complications.   Gregory Mayer DPM  

## 2023-11-20 DIAGNOSIS — Z23 Encounter for immunization: Secondary | ICD-10-CM | POA: Diagnosis not present

## 2023-11-25 DIAGNOSIS — M810 Age-related osteoporosis without current pathological fracture: Secondary | ICD-10-CM | POA: Diagnosis not present

## 2023-11-25 DIAGNOSIS — E785 Hyperlipidemia, unspecified: Secondary | ICD-10-CM | POA: Diagnosis not present

## 2023-11-25 DIAGNOSIS — E7849 Other hyperlipidemia: Secondary | ICD-10-CM | POA: Diagnosis not present

## 2023-11-25 DIAGNOSIS — I10 Essential (primary) hypertension: Secondary | ICD-10-CM | POA: Diagnosis not present

## 2023-11-25 DIAGNOSIS — E538 Deficiency of other specified B group vitamins: Secondary | ICD-10-CM | POA: Diagnosis not present

## 2023-11-25 DIAGNOSIS — E441 Mild protein-calorie malnutrition: Secondary | ICD-10-CM | POA: Diagnosis not present

## 2023-11-25 DIAGNOSIS — E269 Hyperaldosteronism, unspecified: Secondary | ICD-10-CM | POA: Diagnosis not present

## 2023-12-02 DIAGNOSIS — R82998 Other abnormal findings in urine: Secondary | ICD-10-CM | POA: Diagnosis not present

## 2023-12-02 DIAGNOSIS — I10 Essential (primary) hypertension: Secondary | ICD-10-CM | POA: Diagnosis not present

## 2023-12-02 DIAGNOSIS — Z79891 Long term (current) use of opiate analgesic: Secondary | ICD-10-CM | POA: Diagnosis not present

## 2023-12-02 DIAGNOSIS — Z7409 Other reduced mobility: Secondary | ICD-10-CM | POA: Diagnosis not present

## 2023-12-02 DIAGNOSIS — Z0000 Encounter for general adult medical examination without abnormal findings: Secondary | ICD-10-CM | POA: Diagnosis not present

## 2023-12-02 DIAGNOSIS — M810 Age-related osteoporosis without current pathological fracture: Secondary | ICD-10-CM | POA: Diagnosis not present

## 2023-12-02 DIAGNOSIS — Z1389 Encounter for screening for other disorder: Secondary | ICD-10-CM | POA: Diagnosis not present

## 2023-12-02 DIAGNOSIS — E441 Mild protein-calorie malnutrition: Secondary | ICD-10-CM | POA: Diagnosis not present

## 2023-12-02 DIAGNOSIS — M159 Polyosteoarthritis, unspecified: Secondary | ICD-10-CM | POA: Diagnosis not present

## 2023-12-02 DIAGNOSIS — R32 Unspecified urinary incontinence: Secondary | ICD-10-CM | POA: Diagnosis not present

## 2023-12-02 DIAGNOSIS — G8929 Other chronic pain: Secondary | ICD-10-CM | POA: Diagnosis not present

## 2023-12-02 DIAGNOSIS — Z1331 Encounter for screening for depression: Secondary | ICD-10-CM | POA: Diagnosis not present

## 2023-12-02 DIAGNOSIS — Z23 Encounter for immunization: Secondary | ICD-10-CM | POA: Diagnosis not present

## 2023-12-25 ENCOUNTER — Encounter: Payer: Self-pay | Admitting: Podiatry

## 2023-12-25 ENCOUNTER — Ambulatory Visit (INDEPENDENT_AMBULATORY_CARE_PROVIDER_SITE_OTHER): Admitting: Podiatry

## 2023-12-25 DIAGNOSIS — M79676 Pain in unspecified toe(s): Secondary | ICD-10-CM

## 2023-12-25 DIAGNOSIS — B351 Tinea unguium: Secondary | ICD-10-CM

## 2023-12-25 DIAGNOSIS — I872 Venous insufficiency (chronic) (peripheral): Secondary | ICD-10-CM

## 2023-12-25 NOTE — Progress Notes (Signed)
This patient returns to my office for at risk foot care.  This patient requires this care by a professional since this patient will be at risk due to having edema due to chronic venous insufficiency.  This patient is unable to cut nails herself since the patient cannot reach her nails.These nails are painful walking and wearing shoes.  She requests no treatment on the corn on the third toe right foot.  This patient presents for at risk foot care today.  General Appearance  Alert, conversant and in no acute stress.  Vascular  Dorsalis pedis and posterior tibial  pulses are weakly  palpable  bilaterally.  Capillary return is within normal limits  bilaterally. Cold feet  Bilaterally. Absent digital hair.  Neurologic  Senn-Weinstein monofilament wire test within normal limits  bilaterally. Muscle power within normal limits bilaterally.  Nails Thick disfigured discolored nails with subungual debris  from hallux to fifth toes bilaterally. No evidence of bacterial infection or drainage bilaterally.  Orthopedic  No limitations of motion  feet .  No crepitus or effusions noted.  No bony pathology or digital deformities noted.  Skin  normotropic skin with no porokeratosis noted bilaterally.  No signs of infections or ulcers noted.     Onychomycosis  Pain in right toes  Pain in left toes  Consent was obtained for treatment procedures.   Mechanical debridement of nails 1-5  bilaterally performed with a nail nipper.  Filed with dremel without incident.    Return office visit   3 months                  Told patient to return for periodic foot care and evaluation due to potential at risk complications.   Gregory Mayer DPM  

## 2024-03-27 ENCOUNTER — Encounter (HOSPITAL_COMMUNITY): Payer: Self-pay | Admitting: *Deleted

## 2024-03-27 ENCOUNTER — Emergency Department (HOSPITAL_COMMUNITY)
Admission: EM | Admit: 2024-03-27 | Discharge: 2024-03-28 | Disposition: A | Discharge status: Home / Self Care | Attending: Emergency Medicine | Admitting: Emergency Medicine

## 2024-03-27 ENCOUNTER — Other Ambulatory Visit: Payer: Self-pay

## 2024-03-27 ENCOUNTER — Emergency Department (HOSPITAL_COMMUNITY)

## 2024-03-27 DIAGNOSIS — R0789 Other chest pain: Secondary | ICD-10-CM | POA: Diagnosis not present

## 2024-03-27 DIAGNOSIS — I10 Essential (primary) hypertension: Secondary | ICD-10-CM | POA: Insufficient documentation

## 2024-03-27 DIAGNOSIS — Z85810 Personal history of malignant neoplasm of tongue: Secondary | ICD-10-CM | POA: Insufficient documentation

## 2024-03-27 DIAGNOSIS — I517 Cardiomegaly: Secondary | ICD-10-CM | POA: Diagnosis not present

## 2024-03-27 DIAGNOSIS — Z79899 Other long term (current) drug therapy: Secondary | ICD-10-CM | POA: Insufficient documentation

## 2024-03-27 DIAGNOSIS — I700 Atherosclerosis of aorta: Secondary | ICD-10-CM | POA: Diagnosis not present

## 2024-03-27 DIAGNOSIS — R079 Chest pain, unspecified: Secondary | ICD-10-CM | POA: Diagnosis not present

## 2024-03-27 LAB — BASIC METABOLIC PANEL WITH GFR
Anion gap: 12 (ref 5–15)
BUN: 38 mg/dL — ABNORMAL HIGH (ref 8–23)
CO2: 22 mmol/L (ref 22–32)
Calcium: 8.8 mg/dL — ABNORMAL LOW (ref 8.9–10.3)
Chloride: 106 mmol/L (ref 98–111)
Creatinine, Ser: 1.33 mg/dL — ABNORMAL HIGH (ref 0.44–1.00)
GFR, Estimated: 37 mL/min — ABNORMAL LOW (ref >60)
Glucose, Bld: 113 mg/dL — ABNORMAL HIGH (ref 70–99)
Potassium: 4.6 mmol/L (ref 3.5–5.1)
Sodium: 140 mmol/L (ref 135–145)

## 2024-03-27 LAB — CBC
HCT: 39 % (ref 36.0–46.0)
Hemoglobin: 12.2 g/dL (ref 12.0–15.0)
MCH: 29.7 pg (ref 26.0–34.0)
MCHC: 31.3 g/dL (ref 30.0–36.0)
MCV: 94.9 fL (ref 80.0–100.0)
Platelets: 253 K/uL (ref 150–400)
RBC: 4.11 MIL/uL (ref 3.87–5.11)
RDW: 13.4 % (ref 11.5–15.5)
WBC: 7.3 K/uL (ref 4.0–10.5)
nRBC: 0 % (ref 0.0–0.2)

## 2024-03-27 LAB — TROPONIN I (HIGH SENSITIVITY): Troponin I (High Sensitivity): 11 ng/L (ref <18)

## 2024-03-27 LAB — I-STAT CHEM 8, ED
BUN: 38 mg/dL — ABNORMAL HIGH (ref 8–23)
Calcium, Ion: 1.15 mmol/L (ref 1.15–1.40)
Chloride: 108 mmol/L (ref 98–111)
Creatinine, Ser: 1.7 mg/dL — ABNORMAL HIGH (ref 0.44–1.00)
Glucose, Bld: 117 mg/dL — ABNORMAL HIGH (ref 70–99)
HCT: 36 % (ref 36.0–46.0)
Hemoglobin: 12.2 g/dL (ref 12.0–15.0)
Potassium: 4.7 mmol/L (ref 3.5–5.1)
Sodium: 142 mmol/L (ref 135–145)
TCO2: 22 mmol/L (ref 22–32)

## 2024-03-27 NOTE — ED Triage Notes (Signed)
 Chest pain for a few days today the pain is worse   the pain is just under both her breasts

## 2024-03-28 DIAGNOSIS — R0789 Other chest pain: Secondary | ICD-10-CM | POA: Diagnosis not present

## 2024-03-28 LAB — TROPONIN I (HIGH SENSITIVITY): Troponin I (High Sensitivity): 12 ng/L (ref <18)

## 2024-03-28 NOTE — ED Provider Notes (Signed)
 [place] EMERGENCY DEPARTMENT AT [facility] Provider Note   CSN: [id number] Arrival date & time: 03/27/24  2034     Patient presents with: Chest Pain   Stephanie [name] is a 89 y.o. female.   HPI     This is a 89 year old female with a history of hypertension who presents with chest pain.  Patient reports chest pain under her bilateral breast.  She thought that it was related to her bra being too tight.  She states that this has happened before and she would unhook her bra and have relief of symptoms.  However today she noted that symptoms persisted even after unhooking her bra.  No shortness of breath.  She is unsure of whether her symptoms may have been worse when she was up walking around.  She is currently symptom-free.  No recent fevers or cough.  Denies any history of heart disease.  Denies any history of ischemic workup or being followed by cardiology.  No leg swelling or history of blood clots.  Prior to Admission medications   Medication Sig Start Date End Date Taking? Authorizing Provider  aliskiren  (TEKTURNA ) 300 MG tablet Take 300 mg by mouth daily.    [provider]  amLODipine  (NORVASC ) 2.5 MG tablet 1 tablet 09/21/20   [provider]  amLODipine  (NORVASC ) 5 MG tablet Take 5 mg by mouth daily.    [provider]  augmented betamethasone dipropionate (DIPROLENE-AF) 0.05 % ointment See admin instructions. 10/22/19   [provider]  BESIVANCE 0.6 % SUSP  03/22/20   [provider]  Bromfenac Sodium (PROLENSA) 0.07 % SOLN INSTILL 1 DROP INTO RIGHT EYE EVERY EVENING AS DIRECTED 01/25/20   [provider]  Calcium Carb-Cholecalciferol (OS-CAL) 500-600 MG-UNIT CHEW 1 tablet with a meal    [provider]  carvedilol  (COREG ) 6.25 MG tablet Take 6.25 mg by mouth 2 (two) times daily.     [provider]  cephALEXin  (KEFLEX ) 500 MG capsule 1 capsule    [provider]  Cholecalciferol (VITAMIN D)  50 MCG (2000 UT) tablet Take 2,000 Units by mouth daily.    [provider]  cyanocobalamin  1000 MCG tablet Take 1 tablet by mouth daily.    [provider]  Difluprednate (DUREZOL) 0.05 % EMUL APPLY 1 DROP INTO RIGHT EYE THREE TIMES A DAY AS DIRECTED 01/26/20   [provider]  doxycycline (VIBRAMYCIN) 100 MG capsule Take 1 tablet twice a day for 5-10 days for leg cellulitis 12/27/20   [provider]  furosemide  (LASIX ) 20 MG tablet Take 1 tablet (20 mg total) by mouth daily. 07/10/18   Ward, Jaime Pilcher, PA-C  HYDROcodone -acetaminophen  (NORCO) 5-325 MG per tablet Take 1 tablet by mouth every 4 (four) hours as needed for moderate pain or severe pain.     [provider]  HYDROcodone -acetaminophen  (NORCO/VICODIN) 5-325 MG tablet Take 1 tablet by mouth every 6 (six) hours as needed. 04/14/20   [provider]  Potassium Chloride  ER 20 MEQ TBCR 1 tablet with food    [provider]  potassium chloride  SA (K-DUR,KLOR-CON ) 20 MEQ tablet Take 20 mEq by mouth daily.    [provider]  raloxifene  (EVISTA ) 60 MG tablet Take 60 mg by mouth daily.    [provider]  ramipril  (ALTACE ) 10 MG capsule Take 10 mg by mouth daily.    [provider]  vitamin C (ASCORBIC ACID) 500 MG tablet Take 500 mg by mouth daily.  [provider]    Allergies: Morphine and codeine    Review of Systems  Constitutional:  Negative for fever.  Respiratory:  Negative for shortness of breath.   Cardiovascular:  Positive for chest pain. Negative for leg swelling.  Gastrointestinal:  Negative for abdominal pain.  All other systems reviewed and are negative.   Updated Vital Signs BP (!) 115/59 (BP Location: Right Arm)   Pulse 71   Temp 98.6 F (37 C) (Oral)   Resp 14   Ht 1.499 m (4' 11)   Wt 50.8 kg   SpO2 96%   BMI 22.62 kg/m   Physical Exam Vitals and nursing note reviewed.  Constitutional:      Appearance: She  is well-developed.     Comments: Elderly, nontoxic-appearing  HENT:     Head: Normocephalic and atraumatic.  Eyes:     Pupils: Pupils are equal, round, and reactive to light.  Cardiovascular:     Rate and Rhythm: Normal rate and regular rhythm.     Heart sounds: Normal heart sounds.  Pulmonary:     Effort: Pulmonary effort is normal. No respiratory distress.     Breath sounds: No wheezing.  Chest:     Chest wall: No tenderness.  Abdominal:     General: Bowel sounds are normal.     Palpations: Abdomen is soft.  Musculoskeletal:     Cervical back: Neck supple.     Right lower leg: No tenderness. No edema.     Left lower leg: No tenderness. No edema.     Comments: Kyphosis  Skin:    General: Skin is warm and dry.     Comments: No rash noted under the bra line  Neurological:     Mental Status: She is alert and oriented to person, place, and time.  Psychiatric:        Mood and Affect: Mood normal.     (all labs ordered are listed, but only abnormal results are displayed) Labs Reviewed  BASIC METABOLIC PANEL WITH GFR - Abnormal; Notable for the following components:      Result Value   Glucose, Bld 113 (*)    BUN 38 (*)    Creatinine, Ser 1.33 (*)    Calcium 8.8 (*)    GFR, Estimated 37 (*)    All other components within normal limits  I-STAT CHEM 8, ED - Abnormal; Notable for the following components:   BUN 38 (*)    Creatinine, Ser 1.70 (*)    Glucose, Bld 117 (*)    All other components within normal limits  CBC  I-STAT CHEM 8, ED  TROPONIN I (HIGH SENSITIVITY)  TROPONIN I (HIGH SENSITIVITY)    EKG: EKG Interpretation Date/Time:  Saturday March 27 2024 21:09:50 EDT Ventricular Rate:  75 PR Interval:  188 QRS Duration:  84 QT Interval:  376 QTC Calculation: 419 R Axis:   -31  Text Interpretation: Normal sinus rhythm Left axis deviation Pulmonary disease pattern Septal infarct , age undetermined Abnormal ECG When compared with ECG of 16-Mar-2017 00:38,  PREVIOUS ECG IS PRESENT Confirmed by Bari Pfeiffer ([id number]) on 03/28/2024 2:13:17 AM  Radiology: [facility] Chest 2 View Result Date: 03/27/2024 CLINICAL DATA:  Chest pain for several days, initial encounter EXAM: CHEST - 2 VIEW COMPARISON:  07/10/2018 FINDINGS: Cardiac shadow is enlarged. Aortic calcifications are noted. The lungs are well aerated bilaterally. No focal infiltrate or effusion is seen. No acute bony abnormality is noted. Chronic compression deformity in the lower  thoracic spine is noted. IMPRESSION: No active cardiopulmonary disease. Electronically Signed   By: Oneil Devonshire M.D.   On: 03/27/2024 23:08     Procedures   Medications Ordered in the ED - No data to display                                  Medical Decision Making  This patient presents to the ED for concern of chest pain, this involves an extensive number of treatment options, and is a complaint that carries with it a high risk of complications and morbidity.  I considered the following differential and admission for this acute, potentially life threatening condition.  The differential diagnosis includes ACS, PE, pneumothorax, pneumonia, chest wall pain  MDM:    This is a 89 year old female who presents with chest pain.  Currently is asymptomatic.  Vital signs are reassuring and blood pressure was initially 161/58 which improved to 115/59.  She did take her home blood pressure medication tonight.  Pain is fairly atypical.  She relates it to a tight bra but also is unsure if it may be worsened with exertion.  EKG shows no evidence of acute ischemia or arrhythmia.  Chest x-ray without pneumothorax or pneumonia.  Troponin x 2 negative.  Low suspicion for PE as she is not hypoxic or tachycardic.  Discussed with patient and her daughter that she may or may not be a candidate for further cardiac testing given her age.  However given that she has had minimal cardiac testing at this point would be reasonable to refer to cardiology.   She did have an echocardiogram in 2018 which showed a normal EF with mild diastolic dysfunction.  She has remained asymptomatic in the emergency department.  They are both agreeable to plan with follow-up with cardiology.  (Labs, imaging, consults)  Labs: I Ordered, and personally interpreted labs.  The pertinent results include: CBC, BMP, troponin x 2  Imaging Studies ordered: I ordered imaging studies including x-ray I independently visualized and interpreted imaging. I agree with the radiologist interpretation  Additional history obtained from review.  External records from outside source obtained and reviewed including prior evaluations  Cardiac Monitoring: The patient was maintained on a cardiac monitor.  If on the cardiac monitor, I personally viewed and interpreted the cardiac monitored which showed an underlying rhythm of: Sinus  Reevaluation: After the interventions noted above, I reevaluated the patient and found that they have :resolved  Social Determinants of Health:  lives independently  Disposition: Discharge with cardiology follow-up  Co morbidities that complicate the patient evaluation  Past Medical History:  Diagnosis Date   Arthritis    Blood transfusion    Last hip surgery.   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   I have reviewed the patients home medicines and have made adjustments as needed  Problem List / ED Course: Problem List Items Addressed This Visit   None Visit Diagnoses       Atypical chest pain    -  Primary   Relevant Orders   Ambulatory referral to Cardiology                Final diagnoses:  Atypical chest pain    ED Discharge Orders  Ordered    Ambulatory referral to Cardiology        03/28/24 0238               Bari Charmaine FALCON,  MD 03/28/24 [phone]

## 2024-03-28 NOTE — Discharge Instructions (Signed)
 You were seen today for chest pain.  Your workup today is largely reassuring.  Follow-up with cardiology.  They may or may not recommend further testing given your age.

## 2024-04-01 ENCOUNTER — Encounter: Payer: Self-pay | Admitting: Podiatry

## 2024-04-01 ENCOUNTER — Ambulatory Visit (INDEPENDENT_AMBULATORY_CARE_PROVIDER_SITE_OTHER): Admitting: Podiatry

## 2024-04-01 DIAGNOSIS — B351 Tinea unguium: Secondary | ICD-10-CM

## 2024-04-01 DIAGNOSIS — M79676 Pain in unspecified toe(s): Secondary | ICD-10-CM

## 2024-04-01 DIAGNOSIS — I878 Other specified disorders of veins: Secondary | ICD-10-CM

## 2024-04-01 DIAGNOSIS — I872 Venous insufficiency (chronic) (peripheral): Secondary | ICD-10-CM

## 2024-04-01 NOTE — Progress Notes (Signed)
 This patient returns to my office for at risk foot care.  This patient requires this care by a professional since this patient will be at risk due to having edema due to chronic venous insufficiency.  This patient is unable to cut nails herself since the patient cannot reach her nails.These nails are painful walking and wearing shoes.  She requests no treatment on the corn on the third toe right foot.  This patient presents for at risk foot care today.  General Appearance  Alert, conversant and in no acute stress.  Vascular  Dorsalis pedis and posterior tibial  pulses are weakly  palpable  bilaterally.  Capillary return is within normal limits  bilaterally. Cold feet  Bilaterally. Absent digital hair.  Neurologic  Senn-Weinstein monofilament wire test within normal limits  bilaterally. Muscle power within normal limits bilaterally.  Nails Thick disfigured discolored nails with subungual debris  from hallux to fifth toes bilaterally. No evidence of bacterial infection or drainage bilaterally.  Orthopedic  No limitations of motion  feet .  No crepitus or effusions noted.  No bony pathology or digital deformities noted.  Skin  normotropic skin with no porokeratosis noted bilaterally.  No signs of infections or ulcers noted.     Onychomycosis  Pain in right toes  Pain in left toes  Consent was obtained for treatment procedures.   Mechanical debridement of nails 1-5  bilaterally performed with a nail nipper.  Filed with dremel without incident.    Return office visit   3 months                  Told patient to return for periodic foot care and evaluation due to potential at risk complications.   Cordella Bold DPM [facility]

## 2024-04-02 DIAGNOSIS — I10 Essential (primary) hypertension: Secondary | ICD-10-CM | POA: Diagnosis not present

## 2024-04-02 DIAGNOSIS — G4762 Sleep related leg cramps: Secondary | ICD-10-CM | POA: Diagnosis not present

## 2024-04-02 DIAGNOSIS — M810 Age-related osteoporosis without current pathological fracture: Secondary | ICD-10-CM | POA: Diagnosis not present

## 2024-04-02 DIAGNOSIS — R0789 Other chest pain: Secondary | ICD-10-CM | POA: Diagnosis not present

## 2024-07-01 ENCOUNTER — Ambulatory Visit: Admitting: Podiatry

## 2024-07-01 ENCOUNTER — Encounter: Payer: Self-pay | Admitting: Podiatry

## 2024-07-01 VITALS — Ht 59.0 in | Wt 112.0 lb

## 2024-07-01 DIAGNOSIS — B351 Tinea unguium: Secondary | ICD-10-CM

## 2024-07-01 DIAGNOSIS — I872 Venous insufficiency (chronic) (peripheral): Secondary | ICD-10-CM | POA: Diagnosis not present

## 2024-07-01 DIAGNOSIS — M79676 Pain in unspecified toe(s): Secondary | ICD-10-CM

## 2024-07-01 NOTE — Progress Notes (Signed)
 This patient returns to my office for at risk foot care.  This patient requires this care by a professional since this patient will be at risk due to having edema due to chronic venous insufficiency.  This patient is unable to cut nails herself since the patient cannot reach her nails.These nails are painful walking and wearing shoes.    This patient presents for at risk foot care today.  General Appearance  Alert, conversant and in no acute stress.  Vascular  Dorsalis pedis and posterior tibial  pulses are weakly  palpable  bilaterally.  Capillary return is within normal limits  bilaterally. Cold feet  Bilaterally. Absent digital hair.  Neurologic  Senn-Weinstein monofilament wire test within normal limits  bilaterally. Muscle power within normal limits bilaterally.  Nails Thick disfigured discolored nails with subungual debris  from hallux to fifth toes bilaterally. No evidence of bacterial infection or drainage bilaterally.  Orthopedic  No limitations of motion  feet .  No crepitus or effusions noted.  No bony pathology.  Hammer toe 2nd right.  Skin  normotropic skin with no porokeratosis noted bilaterally.  No signs of infections or ulcers noted.     Onychomycosis  Pain in right toes  Pain in left toes  Consent was obtained for treatment procedures.   Mechanical debridement of nails 1-5  bilaterally performed with a nail nipper.  Filed with dremel without incident.    Return office visit   3 months                  Told patient to return for periodic foot care and evaluation due to potential at risk complications.   Cordella Bold DPM [facility]

## 2024-10-06 ENCOUNTER — Ambulatory Visit: Admitting: Podiatry
